# Patient Record
Sex: Female | Born: 1935 | Hispanic: No
Health system: Southern US, Community
[De-identification: ages and names within clinical notes are randomized; demographics above are authoritative.]

## PROBLEM LIST (undated history)

## (undated) DIAGNOSIS — E065 Other chronic thyroiditis: Secondary | ICD-10-CM

## (undated) DIAGNOSIS — F32A Depression, unspecified: Secondary | ICD-10-CM

## (undated) DIAGNOSIS — F05 Delirium due to known physiological condition: Secondary | ICD-10-CM

## (undated) DIAGNOSIS — F0390 Unspecified dementia without behavioral disturbance: Secondary | ICD-10-CM

## (undated) DIAGNOSIS — F329 Major depressive disorder, single episode, unspecified: Secondary | ICD-10-CM

## (undated) DIAGNOSIS — F19951 Other psychoactive substance use, unspecified with psychoactive substance-induced psychotic disorder with hallucinations: Secondary | ICD-10-CM

## (undated) DIAGNOSIS — R410 Disorientation, unspecified: Secondary | ICD-10-CM

---

## 2010-02-12 ENCOUNTER — Encounter: Payer: Self-pay | Admitting: Internal Medicine

## 2010-02-28 ENCOUNTER — Ambulatory Visit: Payer: Self-pay | Admitting: Internal Medicine

## 2010-02-28 DIAGNOSIS — N390 Urinary tract infection, site not specified: Secondary | ICD-10-CM

## 2010-02-28 DIAGNOSIS — E0590 Thyrotoxicosis, unspecified without thyrotoxic crisis or storm: Secondary | ICD-10-CM | POA: Insufficient documentation

## 2010-02-28 DIAGNOSIS — E785 Hyperlipidemia, unspecified: Secondary | ICD-10-CM

## 2010-02-28 DIAGNOSIS — F068 Other specified mental disorders due to known physiological condition: Secondary | ICD-10-CM

## 2010-02-28 LAB — CONVERTED CEMR LAB
Ketones, ur: NEGATIVE mg/dL
Leukocytes, UA: NEGATIVE
Nitrite: NEGATIVE
Specific Gravity, Urine: 1.025 (ref 1.000–1.030)
pH: 6.5 (ref 5.0–8.0)

## 2010-03-09 ENCOUNTER — Ambulatory Visit: Payer: Self-pay | Admitting: Endocrinology

## 2010-03-22 ENCOUNTER — Telehealth: Payer: Self-pay | Admitting: Endocrinology

## 2010-05-16 ENCOUNTER — Telehealth: Payer: Self-pay | Admitting: Endocrinology

## 2010-07-24 NOTE — Assessment & Plan Note (Signed)
Summary: NEW / MEDICARE / # / CD--Rm 8   Vital Signs:  Patient profile:   77 year old female Weight:      119.75 pounds O2 Sat:      97 % on Room air Temp:     97.0 degrees F oral Pulse rate:   90 / minute Pulse rhythm:   regular Resp:     16 per minute BP sitting:   120 / 50  (left arm) Cuff size:   regular  Vitals Entered By: Mervin Kung CMA (AAMA) (February 28, 2010 10:04 AM)  O2 Flow:  Room air CC: Rm 8  New pt.   Primary Care Provider:  Etta Grandchild MD  CC:  Rm 8  New pt..  History of Present Illness: New to me she is brought in by her son who reoprts that her PCP (Dr. Redmond School) at [facility] wants her screened for a UTI due to some lethargy she experiemced last week. She is back to baseline now. She offers no complaints and has been eating well but she is severely demented so symptoms are not obtainable.  Also, Dr. Redmond School has asked that she see an Endocrinologist as she had labs done a few weeks ago that showed a slightly suppressed TSH.  Preventive Screening-Counseling & Management  Alcohol-Tobacco     Alcohol drinks/day: 0     Smoking Status: never     Tobacco Counseling: not indicated; no tobacco use  Hep-HIV-STD-Contraception     Hepatitis Risk: no risk noted     HIV Risk: no risk noted     STD Risk: no risk noted      Drug Use:  no.    Current Medications (verified): 1)  Citalopram Hydrobromide 20 Mg Tabs (Citalopram Hydrobromide) .... Take 1 Tablet By Mouth Once A Day. 2)  Vitamin D 2000 Unit Tabs (Cholecalciferol) .... Take 1 Tablet By Mouth Once A Day. 3)  Mirtazapine 15 Mg Tabs (Mirtazapine) .... Take 1/2 Tablet At Bedtime. 4)  Trazodone Hcl 50 Mg Tabs (Trazodone Hcl) .... Take 1/2 Tablet At Bedtime. 5)  Exelon 9.5 Mg/24hr Pt24 (Rivastigmine) .... Apply 1 Patch Daily At 8am. 6)  Risperdal 0.25 Mg Tabs (Risperidone) .... Take 1 Tablet Every Morning. 7)  Tapazole 5 Mg Tabs (Methimazole) .... Take 1 Tablet By Mouth Two Times A Day.  Allergies  (verified): No Known Drug Allergies  Past History:  Past Medical History: Dementia Hyperlipidemia  Family History: Family History Breast cancer 1st degree relative <50 Family History Diabetes 1st degree relative  Social History: Retired Conservation officer, nature Never Smoked Alcohol use-no Drug use-no Smoking Status:  never Hepatitis Risk:  no risk noted HIV Risk:  no risk noted STD Risk:  no risk noted Drug Use:  no  Review of Systems       The patient complains of incontinence and difficulty walking.  The patient denies anorexia, fever, weight loss, weight gain, chest pain, peripheral edema, prolonged cough, headaches, hemoptysis, abdominal pain, and hematuria.   GU:  Complains of incontinence and urinary hesitancy; denies discharge, hematuria, nocturia, and urinary frequency.  Physical Exam  General:  alert, well-nourished, well-hydrated, poorly cooperative to examination, and unable to place on exam table.   Head:  normocephalic, atraumatic, no abnormalities observed, and no abnormalities palpated.   Eyes:  pink moist mm., no icterus or lesions Mouth:  Oral mucosa and oropharynx without lesions or exudates.  Teeth in good repair. Neck:  supple, full ROM, no masses, no thyromegaly, no  thyroid nodules or tenderness, no JVD, normal carotid upstroke, no carotid bruits, no cervical lymphadenopathy, and no neck tenderness.   Lungs:  normal respiratory effort, no intercostal retractions, no accessory muscle use, normal breath sounds, no dullness, no fremitus, no crackles, and no wheezes.   Heart:  normal rate, regular rhythm, no murmur, no gallop, no rub, and no JVD.   Abdomen:  soft, non-tender, normal bowel sounds, no distention, no masses, no guarding, no rigidity, no rebound tenderness, no abdominal hernia, no inguinal hernia, no hepatomegaly, and no splenomegaly.   Msk:  normal ROM, no joint tenderness, no joint swelling, no joint warmth, no redness over joints, no joint deformities, no  joint instability, and no crepitation.   Pulses:  R and L carotid,radial,femoral,dorsalis pedis and posterior tibial pulses are full and equal bilaterally Extremities:  No clubbing, cyanosis, edema, or deformity noted with normal full range of motion of all joints.   Neurologic:  Her speech is coherent but not appropriate with respect to the conversation of questions. cranial nerves II-XII intact, strength normal in all extremities, confused, and ataxic gait.   Skin:  turgor normal, color normal, no rashes, no suspicious lesions, no ecchymoses, no petechiae, no purpura, no ulcerations, and no edema.   Cervical Nodes:  no anterior cervical adenopathy and no posterior cervical adenopathy.   Axillary Nodes:  no R axillary adenopathy and no L axillary adenopathy.   Inguinal Nodes:  no R inguinal adenopathy and no L inguinal adenopathy.   Psych:  good eye contact, not anxious appearing, not depressed appearing, poor concentration, memory impairment, disoriented to time, disoriented to place, disoriented to person, and judgment poor.     Impression & Recommendations:  Problem # 1:  UTI (ICD-599.0) Assessment New will check urine and will treat if positive Orders: T-Urine Culture (Spectrum Order) [id number]) TLB-Udip w/ Micro (81001-URINE)  Problem # 2:  HYPERTHYROIDISM (ICD-242.90) Assessment: Unchanged  Her updated medication list for this problem includes:    Tapazole 5 Mg Tabs (Methimazole) .Marland Kitchen... Take 1 tablet by mouth two times a day.  Orders: Endocrinology Referral (Endocrine)  Problem # 3:  DEMENTIA (ICD-294.8) Assessment: Unchanged  Complete Medication List: 1)  Citalopram Hydrobromide 20 Mg Tabs (Citalopram hydrobromide) .... Take 1 tablet by mouth once a day. 2)  Vitamin D 2000 Unit Tabs (Cholecalciferol) .... Take 1 tablet by mouth once a day. 3)  Mirtazapine 15 Mg Tabs (Mirtazapine) .... Take 1/2 tablet at bedtime. 4)  Trazodone Hcl 50 Mg Tabs (Trazodone hcl) .... Take 1/2  tablet at bedtime. 5)  Exelon 9.5 Mg/24hr Pt24 (Rivastigmine) .... Apply 1 patch daily at 8am. 6)  Risperdal 0.25 Mg Tabs (Risperidone) .... Take 1 tablet every morning. 7)  Tapazole 5 Mg Tabs (Methimazole) .... Take 1 tablet by mouth two times a day.  Patient Instructions: 1)  Please schedule a follow-up appointment as needed.  Current Allergies (reviewed today): No known allergies

## 2010-07-24 NOTE — Progress Notes (Signed)
Summary: rx refill req  Phone Note From Pharmacy Call back at [phone]   Summary of Call: [facility] sent refill request for Methimazole 5mg  tablets take 1 tablet by mouth once daily-please advise Initial call taken by: Brenton Grills MA,  March 22, 2010 9:11 AM  Follow-up for Phone Call        please refill x 1 Follow-up by: Minus Breeding MD,  March 22, 2010 10:34 AM    New/Updated Medications: TAPAZOLE 5 MG TABS (METHIMAZOLE) take 1 tablet by mouth once daily Prescriptions: TAPAZOLE 5 MG TABS (METHIMAZOLE) take 1 tablet by mouth once daily  #30 x 1   Entered by:   Brenton Grills MA   Authorized by:   Minus Breeding MD   Signed by:   Brenton Grills MA on 03/22/2010   Method used:   Faxed to ...       [facility] (mail-order)             , [place]         Ph:        Fax: [phone]   RxID:   [id number]

## 2010-07-24 NOTE — Progress Notes (Signed)
Summary: rx refil req  Phone Note Refill Request Message from:  Fax from Pharmacy on May 16, 2010 4:54 PM  Refills Requested: Medication #1:  TAPAZOLE 5 MG TABS take 1 tablet by mouth once daily.   Dosage confirmed as above?Dosage Confirmed   Last Refilled: 03/18/2010  Method Requested: Fax to Mail Away Pharmacy Next Appointment Scheduled: none Initial call taken by: Brenton Grills CMA Duncan Dull),  May 16, 2010 4:54 PM    Prescriptions: TAPAZOLE 5 MG TABS (METHIMAZOLE) take 1 tablet by mouth once daily  #30 x 1   Entered by:   Brenton Grills CMA (AAMA)   Authorized by:   Minus Breeding MD   Signed by:   Brenton Grills CMA (AAMA) on 05/16/2010   Method used:   Faxed to ...       [facility] (mail-order)             , [place]         Ph:        Fax: [phone]   RxID:   [id number]

## 2010-07-24 NOTE — Assessment & Plan Note (Signed)
Summary: new endo hyperthy-medicare-per flag/dr jones-phone/[facility]   Vital Signs:  Patient profile:   77 year old female Weight:      119.75 pounds (54.43 kg) O2 Sat:      98 % on Room air Temp:     97.2 degrees F (36.22 degrees C) oral Pulse rate:   77 / minute BP sitting:   112 / 58  (left arm) Cuff size:   regular  Vitals Entered By: Brenton Grills MA (March 09, 2010 9:52 AM)  O2 Flow:  Room air CC: New Endo/Hyperthyroid/Dr. Jones/aj   Primary Provider:  Etta Grandchild MD  CC:  New Endo/Hyperthyroid/Dr. Jones/aj.  History of Present Illness: hx is limited due to dementia pt states few years of slight tremor of the hands, but no assoc numbness.  suppressed tsh was noted, and she was started on tapazole.  however, the start date of the tapazole is unknown.    Current Medications (verified): 1)  Citalopram Hydrobromide 20 Mg Tabs (Citalopram Hydrobromide) .... Take 1 Tablet By Mouth Once A Day. 2)  Vitamin D 2000 Unit Tabs (Cholecalciferol) .... Take 1 Tablet By Mouth Once A Day. 3)  Mirtazapine 15 Mg Tabs (Mirtazapine) .... Take 1/2 Tablet At Bedtime. 4)  Trazodone Hcl 50 Mg Tabs (Trazodone Hcl) .... Take 1/2 Tablet At Bedtime. 5)  Exelon 9.5 Mg/24hr Pt24 (Rivastigmine) .... Apply 1 Patch Daily At 8am. 6)  Risperdal 0.25 Mg Tabs (Risperidone) .... Take 1 Tablet Every Morning. 7)  Tapazole 5 Mg Tabs (Methimazole) .... Take 1 Tablet By Mouth Two Times A Day.  Allergies (verified): No Known Drug Allergies  Past History:  Past Medical History: Last updated: 02/28/2010 Dementia Hyperlipidemia  Family History: Reviewed history from 02/28/2010 and no changes required. Family History Breast cancer 1st degree relative <50 Family History Diabetes 1st degree relative no thyroid dz  Social History: Reviewed history from 02/28/2010 and no changes required. 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         denies weight loss, hoarseness, double vision, palpitations, diarrhea, polyuria, excessive diaphoresis, seizure, anxiety, hypoglycemia, and easy bruising.  caretaker says pt has had doe in the past.  she has arthralgias and/or myalgias, and rhinorrhea.  Physical Exam  General:  elderly, frail, no distress.  in wheelchair Head:  head: no deformity eyes: no periorbital swelling, no proptosis external nose and ears are normal mouth: no lesion seen Neck:  i do not appreciate a goiter Lungs:  Clear to auscultation bilaterally. Normal respiratory effort.  Heart:  Regular rate and rhythm without murmurs or gallops noted. Normal S1,S2.   Abdomen:  abdomen is soft, nontender.  no hepatosplenomegaly.   not distended.  no hernia  Msk:  muscle bulk and strength are grossly decreased from normal.  no obvious joint swelling.  Pulses:  dorsalis pedis intact bilat.  Extremities:  no deformity (except oa changes). no edema Neurologic:  cn 2-12 grossly intact.   readily moves all 4's.   sensation is intact to touch on all 4's there is a slight tremor of the hands Skin:  normal texture and temp.  no rash.  not diaphoretic  Cervical Nodes:  No significant adenopathy.  Psych:  Alert and cooperative; normal mood.  she has flat affect; not oriented   Impression & Recommendations:  Problem # 1:  HYPERTHYROIDISM (ICD-242.90) uncertain etiology  Problem # 2:  DEMENTIA (ICD-294.8) this limits interpretation of sxs  Problem # 3:  tremor possibly due to #1  Other Orders: TLB-TSH (Thyroid Stimulating Hormone) (84443-TSH) TLB-T4 (Thyrox), Free [phone]) Est. Patient Level V (01027)  Patient Instructions: 1)  blood tests are being ordered today.  we will call [facility] with results, and any med changes. 2)  if ever pt has fever while taking this medication, stop it and call us, because of the risk of a rare side-effect. 3)  Please  schedule a follow-up appointment in 1 month. 4)  (update: i left message on phone-tree:  reduce tapazole to 5 mg once daily.  Appended Document: new endo hyperthy-medicare-per [facility]/dr jones-phone/[facility] FastTSH                   0.68 uIU/mL                 0.35-5.50 Free T4                   0.92 ng/dL

## 2013-10-01 ENCOUNTER — Emergency Department (HOSPITAL_COMMUNITY): Payer: Medicare Other

## 2013-10-01 ENCOUNTER — Encounter (HOSPITAL_COMMUNITY): Payer: Self-pay | Admitting: Emergency Medicine

## 2013-10-01 ENCOUNTER — Inpatient Hospital Stay (HOSPITAL_COMMUNITY)
Admission: EM | Admit: 2013-10-01 | Discharge: 2013-10-05 | DRG: 864 | Disposition: A | Discharge status: Nursing Home (Includes ICF) | Payer: Medicare Other | Attending: Family Medicine | Admitting: Family Medicine

## 2013-10-01 DIAGNOSIS — R633 Feeding difficulties, unspecified: Secondary | ICD-10-CM | POA: Diagnosis present

## 2013-10-01 DIAGNOSIS — Z681 Body mass index (BMI) 19 or less, adult: Secondary | ICD-10-CM

## 2013-10-01 DIAGNOSIS — F3289 Other specified depressive episodes: Secondary | ICD-10-CM | POA: Diagnosis present

## 2013-10-01 DIAGNOSIS — F05 Delirium due to known physiological condition: Secondary | ICD-10-CM

## 2013-10-01 DIAGNOSIS — E785 Hyperlipidemia, unspecified: Secondary | ICD-10-CM | POA: Diagnosis present

## 2013-10-01 DIAGNOSIS — Z66 Do not resuscitate: Secondary | ICD-10-CM | POA: Diagnosis present

## 2013-10-01 DIAGNOSIS — Z515 Encounter for palliative care: Secondary | ICD-10-CM

## 2013-10-01 DIAGNOSIS — E0590 Thyrotoxicosis, unspecified without thyrotoxic crisis or storm: Secondary | ICD-10-CM | POA: Diagnosis present

## 2013-10-01 DIAGNOSIS — R7881 Bacteremia: Secondary | ICD-10-CM

## 2013-10-01 DIAGNOSIS — A419 Sepsis, unspecified organism: Secondary | ICD-10-CM | POA: Diagnosis present

## 2013-10-01 DIAGNOSIS — F0392 Unspecified dementia, unspecified severity, with psychotic disturbance: Secondary | ICD-10-CM | POA: Diagnosis present

## 2013-10-01 DIAGNOSIS — F068 Other specified mental disorders due to known physiological condition: Secondary | ICD-10-CM | POA: Diagnosis present

## 2013-10-01 DIAGNOSIS — F0390 Unspecified dementia without behavioral disturbance: Secondary | ICD-10-CM | POA: Diagnosis present

## 2013-10-01 DIAGNOSIS — E860 Dehydration: Secondary | ICD-10-CM | POA: Diagnosis present

## 2013-10-01 DIAGNOSIS — N390 Urinary tract infection, site not specified: Secondary | ICD-10-CM

## 2013-10-01 DIAGNOSIS — R509 Fever, unspecified: Principal | ICD-10-CM | POA: Diagnosis present

## 2013-10-01 DIAGNOSIS — F329 Major depressive disorder, single episode, unspecified: Secondary | ICD-10-CM | POA: Diagnosis present

## 2013-10-01 DIAGNOSIS — Z993 Dependence on wheelchair: Secondary | ICD-10-CM

## 2013-10-01 DIAGNOSIS — I959 Hypotension, unspecified: Secondary | ICD-10-CM | POA: Diagnosis present

## 2013-10-01 HISTORY — DX: Depression, unspecified: F32.A

## 2013-10-01 HISTORY — DX: Unspecified dementia, unspecified severity, without behavioral disturbance, psychotic disturbance, mood disturbance, and anxiety: F03.90

## 2013-10-01 HISTORY — DX: Delirium due to known physiological condition: F05

## 2013-10-01 HISTORY — DX: Other psychoactive substance use, unspecified with psychoactive substance-induced psychotic disorder with hallucinations: F19.951

## 2013-10-01 HISTORY — DX: Disorientation, unspecified: R41.0

## 2013-10-01 HISTORY — DX: Other chronic thyroiditis: E06.5

## 2013-10-01 HISTORY — DX: Major depressive disorder, single episode, unspecified: F32.9

## 2013-10-01 LAB — URINALYSIS, ROUTINE W REFLEX MICROSCOPIC
Bilirubin Urine: NEGATIVE
Glucose, UA: NEGATIVE mg/dL
Hgb urine dipstick: NEGATIVE
KETONES UR: NEGATIVE mg/dL
Nitrite: NEGATIVE
PROTEIN: NEGATIVE mg/dL
Specific Gravity, Urine: 1.022 (ref 1.005–1.030)
Urobilinogen, UA: 1 mg/dL (ref 0.0–1.0)
pH: 7.5 (ref 5.0–8.0)

## 2013-10-01 LAB — URINE MICROSCOPIC-ADD ON

## 2013-10-01 LAB — COMPREHENSIVE METABOLIC PANEL
ALK PHOS: 65 U/L (ref 39–117)
ALT: 11 U/L (ref 0–35)
AST: 14 U/L (ref 0–37)
Albumin: 3.2 g/dL — ABNORMAL LOW (ref 3.5–5.2)
BUN: 14 mg/dL (ref 6–23)
CO2: 24 mEq/L (ref 19–32)
Calcium: 8.4 mg/dL (ref 8.4–10.5)
Chloride: 100 mEq/L (ref 96–112)
Creatinine, Ser: 0.71 mg/dL (ref 0.50–1.10)
GFR calc non Af Amer: 80 mL/min — ABNORMAL LOW (ref >90)
GLUCOSE: 114 mg/dL — AB (ref 70–99)
Potassium: 4.1 mEq/L (ref 3.7–5.3)
Sodium: 137 mEq/L (ref 137–147)
TOTAL PROTEIN: 6 g/dL (ref 6.0–8.3)
Total Bilirubin: 1.7 mg/dL — ABNORMAL HIGH (ref 0.3–1.2)

## 2013-10-01 LAB — CBC WITH DIFFERENTIAL/PLATELET
Basophils Absolute: 0 10*3/uL (ref 0.0–0.1)
Basophils Relative: 0 % (ref 0–1)
EOS ABS: 0 10*3/uL (ref 0.0–0.7)
Eosinophils Relative: 0 % (ref 0–5)
HEMATOCRIT: 37 % (ref 36.0–46.0)
HEMOGLOBIN: 12.3 g/dL (ref 12.0–15.0)
LYMPHS ABS: 0.4 10*3/uL — AB (ref 0.7–4.0)
Lymphocytes Relative: 8 % — ABNORMAL LOW (ref 12–46)
MCH: 30.1 pg (ref 26.0–34.0)
MCHC: 33.2 g/dL (ref 30.0–36.0)
MCV: 90.5 fL (ref 78.0–100.0)
MONOS PCT: 3 % (ref 3–12)
Monocytes Absolute: 0.2 10*3/uL (ref 0.1–1.0)
NEUTROS PCT: 89 % — AB (ref 43–77)
Neutro Abs: 5.1 10*3/uL (ref 1.7–7.7)
Platelets: 97 10*3/uL — ABNORMAL LOW (ref 150–400)
RBC: 4.09 MIL/uL (ref 3.87–5.11)
RDW: 12.7 % (ref 11.5–15.5)
WBC: 5.8 10*3/uL (ref 4.0–10.5)

## 2013-10-01 LAB — I-STAT CG4 LACTIC ACID, ED: Lactic Acid, Venous: 0.82 mmol/L (ref 0.5–2.2)

## 2013-10-01 LAB — TSH: TSH: 0.715 u[IU]/mL (ref 0.350–4.500)

## 2013-10-01 MED ORDER — PIPERACILLIN-TAZOBACTAM 3.375 G IVPB 30 MIN
3.3750 g | Freq: Once | INTRAVENOUS | Status: AC
  Administered 2013-10-01: 3.375 g via INTRAVENOUS
  Filled 2013-10-01: qty 50

## 2013-10-01 MED ORDER — METHIMAZOLE 5 MG PO TABS
5.0000 mg | ORAL_TABLET | Freq: Every day | ORAL | Status: DC
  Administered 2013-10-03 – 2013-10-05 (×3): 5 mg via ORAL
  Filled 2013-10-01 (×4): qty 1

## 2013-10-01 MED ORDER — VITAMIN D3 25 MCG (1000 UNIT) PO TABS
1000.0000 [IU] | ORAL_TABLET | Freq: Every day | ORAL | Status: DC
  Administered 2013-10-03 – 2013-10-05 (×3): 1000 [IU] via ORAL
  Filled 2013-10-01 (×5): qty 1

## 2013-10-01 MED ORDER — CITALOPRAM HYDROBROMIDE 20 MG PO TABS
20.0000 mg | ORAL_TABLET | Freq: Every day | ORAL | Status: DC
  Administered 2013-10-03 – 2013-10-05 (×3): 20 mg via ORAL
  Filled 2013-10-01 (×4): qty 1

## 2013-10-01 MED ORDER — SENNA 8.6 MG PO TABS
2.0000 | ORAL_TABLET | Freq: Two times a day (BID) | ORAL | Status: DC
  Administered 2013-10-01 – 2013-10-05 (×7): 17.2 mg via ORAL
  Filled 2013-10-01 (×10): qty 2

## 2013-10-01 MED ORDER — ATORVASTATIN CALCIUM 20 MG PO TABS
20.0000 mg | ORAL_TABLET | Freq: Every day | ORAL | Status: DC
  Administered 2013-10-01 – 2013-10-04 (×4): 20 mg via ORAL
  Filled 2013-10-01 (×5): qty 1

## 2013-10-01 MED ORDER — IOHEXOL 300 MG/ML  SOLN
75.0000 mL | Freq: Once | INTRAMUSCULAR | Status: AC | PRN
  Administered 2013-10-01: 75 mL via INTRAVENOUS

## 2013-10-01 MED ORDER — ACETAMINOPHEN 500 MG PO TABS: 500.0000 mg | ORAL_TABLET | Freq: Four times a day (QID) | ORAL | Status: DC | PRN

## 2013-10-01 MED ORDER — PIPERACILLIN-TAZOBACTAM 3.375 G IVPB: 3.3750 g | Freq: Three times a day (TID) | INTRAVENOUS | Status: DC

## 2013-10-01 MED ORDER — SODIUM CHLORIDE 0.9 % IV SOLN: INTRAVENOUS | Status: DC

## 2013-10-01 MED ORDER — VITAMIN D-3 25 MCG (1000 UT) PO CAPS: 1000.0000 [IU] | ORAL_CAPSULE | Freq: Every day | ORAL | Status: DC

## 2013-10-01 MED ORDER — HEPARIN SODIUM (PORCINE) 5000 UNIT/ML IJ SOLN
5000.0000 [IU] | Freq: Three times a day (TID) | INTRAMUSCULAR | Status: DC
  Administered 2013-10-01 – 2013-10-05 (×12): 5000 [IU] via SUBCUTANEOUS
  Filled 2013-10-01 (×15): qty 1

## 2013-10-01 MED ORDER — PIPERACILLIN-TAZOBACTAM 3.375 G IVPB
3.3750 g | Freq: Three times a day (TID) | INTRAVENOUS | Status: DC
  Administered 2013-10-01 – 2013-10-04 (×8): 3.375 g via INTRAVENOUS
  Filled 2013-10-01 (×10): qty 50

## 2013-10-01 MED ORDER — ACETAMINOPHEN 650 MG RE SUPP
650.0000 mg | Freq: Once | RECTAL | Status: AC
  Administered 2013-10-01: 650 mg via RECTAL
  Filled 2013-10-01: qty 1

## 2013-10-01 MED ORDER — VANCOMYCIN HCL 500 MG IV SOLR
500.0000 mg | Freq: Two times a day (BID) | INTRAVENOUS | Status: DC
  Administered 2013-10-01 – 2013-10-03 (×5): 500 mg via INTRAVENOUS
  Filled 2013-10-01 (×7): qty 500

## 2013-10-01 MED ORDER — SODIUM CHLORIDE 0.9 % IV BOLUS (SEPSIS)
1000.0000 mL | Freq: Once | INTRAVENOUS | Status: AC
  Administered 2013-10-01: 1000 mL via INTRAVENOUS

## 2013-10-01 MED ORDER — VANCOMYCIN HCL IN DEXTROSE 750-5 MG/150ML-% IV SOLN
750.0000 mg | Freq: Two times a day (BID) | INTRAVENOUS | Status: DC
  Filled 2013-10-01 (×3): qty 150

## 2013-10-01 MED ORDER — SODIUM CHLORIDE 0.9 % IV SOLN
INTRAVENOUS | Status: DC
Start: 2013-10-01 — End: 2013-10-03
  Administered 2013-10-01 – 2013-10-02 (×2): via INTRAVENOUS

## 2013-10-01 MED ORDER — PIPERACILLIN-TAZOBACTAM 3.375 G IVPB: 3.3750 g | Freq: Four times a day (QID) | INTRAVENOUS | Status: DC

## 2013-10-01 NOTE — ED Notes (Signed)
Pt rolled and cleaned; fresh depends; urine specimen retrieved.

## 2013-10-01 NOTE — H&P (Signed)
I have seen and examined this patient with Dr Birdie SonsSonnenberg.  I agree with his findings and plans as documented in their his admit note.  Systemic Inflammatory Response - (+) Fever, (+) Tachypnea, HR > 90 - Uncertain if there has been any change in cognition or function. - Unable to obtain history from patient secondary to Very Severely Advance Dementia.  - No focal findings on Physical exam, Labs and imaging. - Plan: Empiric Broad Spectrum antibiotics (Vanc/Zosyn) with plan to narrow or stop with clinical improvement, and culture results.  Will repeat CHEST XRAY in 24 to 48 hours to look for an infiltrate "blossoming".

## 2013-10-01 NOTE — ED Notes (Signed)
Patient transported to X-ray 

## 2013-10-01 NOTE — H&P (Signed)
Family Medicine [facility] Admission History and Physical Service Pager: [phone]  Patient name: Desiree RaymondRosie Arias Medical record number: [id number] Date of birth: 02-18-1932 Age: 80 y.o. Gender: female  Primary Care Provider: Sanda Lingerhomas Jones, MD Consultants: none Code Status: DNR  Chief Complaint: fever  Assessment and Plan: Desiree RaymondRosie Arias is a 80 y.o. female presenting with fever to 102.8 with unknown source. PMH is significant for dementia (non verbal), hyperlipidemia, hyperthyroidism   #FUO: initially meeting SIRS criteria at [facility] by tachycardia, fever, and mild hypotension. Resolved on presentation to the ED. Ddx at this time is broad as diagnostic w/up thus far negative. Including URI (mildly tachypneic with crackles on pulm exam but negative cxr/ct), UTI (no indwelling lines, urine with mild leuks no nitrites), gastro (no episodes of diarrhea per home facility), soft tissue/skin infection (no signs of breakdown, ulceration or rashes). Certainly some component of dehydration. Possible early signs of infection that has not yet declared itself. History limited 2/2 patient's inability to communicate -admit to med surg under Dr. McDiarmid -vs per floor protocol -trend fever curve -f/up cultures (Bcx, Ucx) -monitor for changes in clinical exam -rehydration with NS @125  -hold on further abx for now -tylenol prn -consider CBC tmw morning  #Hyperthyroidism: with enlargement of thyroid on CT; TSH 0.68, free T4 0.92 -repeat TSH, T4 -cont methimazole  #Hyperlipidemia: no documented lipid panel; LFTs wnl -cont home dose statin: lipitor 20 -monitor LFTs  #Dementia: Pt non verbal at baseline; will attempt to touch base with daughter to obtain further history. Withdraws limbs to painful stimuli, will open eyes to movements; severe contractures; no signs of skin break down or bruising. Unable to contact daughter, left message to call back to hospital. -cont celexa -avoid  sedating medications -consider very low doses haldol prn if needed for acute delirium; will not schedule at this time -bed turns to avoid pressure ulcers  #FEN/GI: clinically appears dehydrated likely 2/2 above but no signs of insensible losses at this time other than tachypnea and increased WOB -NPO, until SLP eval -NS@125  -senna 17.2mg   Prophylaxis: Hepsq  Disposition: admit to med-surg under Dr. McDiarmid; will attempt to touch base with Daughter. Unable to contact daughter, left message to call back to hospital.  History of Present Illness: Desiree Arias is a 80 y.o. female presenting with tmax at [facility] to 102.8 assc with tachycardia. Level 5 caveat applies 2/2 pt's advanced dementia. Pt brought to ED for further evaluation given SIRS criteria at home facility. Pt otherwise unchanged in last several days, functioning at baseline. Daughter spoken to over phone by the ED physician does not provide any further explanation for fever.   In the ED tmax 99 rectal after tylenol given by EMS. Was mildly hypotensive and given a 1L bolus with improvement in BP to 130s. CBC, CMP, UA, lactic acid wnl. No white count. (5.8) CXR with some mediastinal adenopathy. CT chest obtained, only showing an enlarged thyroid but no mediastinal mass. Ucx, Bcx obtained. Fever noted to be 100.2. But pt normotensive without tachycardia. Pt admitted for observation given fever of unknown origin.   Review Of Systems: Per HPI with the following additions: no recent diarrhea, cough, congestion, cold, rashes, ulcers or skin breakdown per nursing home report Otherwise 12 point review of systems was performed and was unremarkable.  Patient Active Problem List   Diagnosis Date Noted  . HYPERTHYROIDISM 02/28/2010  . HYPERLIPIDEMIA 02/28/2010  . DEMENTIA 02/28/2010  . UTI 02/28/2010   Past Medical History: Past Medical History  Diagnosis Date  . Dementia   . Senile delirium   . Drug hallucinosis   . Chronic  thyroiditis   . Depressive disorder    Past Surgical History: History reviewed. No pertinent past surgical history. Social History: History  Substance Use Topics  . Smoking status: Unknown If Ever Smoked  . Smokeless tobacco: Not on file  . Alcohol Use: No   Additional social history: lives at [facility] Please also refer to relevant sections of EMR.  Family History: History reviewed. No pertinent family history. Allergies and Medications: Not on File No current facility-administered medications on file prior to encounter.   No current outpatient prescriptions on file prior to encounter.    Objective: BP 137/44  Pulse 76  Temp(Src) 100.2 F (37.9 C) (Rectal)  Resp 25  Ht 5\' 4"  (1.626 m)  Wt 110 lb (49.896 kg)  BMI 18.87 kg/m2  SpO2 95% Exam: Gen: Resting quietly in bed, will not open eyes initially to sternal rub but withdraws to pain, eventually opened eyes when patient rolled to visualize her back side HEENT: NCAT,(unable to assess EOMI, pupils 2/2 poor pt cooperation), dry MM, TMs nml Neck: FROM, supple, no lymphadenopathy CV: RRR, good S1/S2, no murmur, cap refill <3 Resp: tachypneic, scattered crackles in the left lung base otherwise CTABL, no wheezes, non-labored Abd: SNTND, BS present, no guarding or organomegaly Ext: No edema, warm, bilat extremity contractures Neuro: non verbal, withdraws to pain Skin: no rashes no lesions or evidence of skin breakdown note especially not in sacral region, buttocks, and hips   Labs and Imaging: CBC BMET   Recent Labs Lab 10/01/13 1002  WBC 5.8  HGB 12.3  HCT 37.0  PLT 97*    Recent Labs Lab 10/01/13 1002  NA 137  K 4.1  CL 100  CO2 24  BUN 14  CREATININE 0.71  GLUCOSE 114*  CALCIUM 8.4     EKG- sinus rhythm, biatrial enlargement, prolong qt CXR- mediastinal adenopathy CT chest- enlarged thyroid  Anselm Lis, MD 10/01/2013, 1:39 PM PGY-1, [facility] FPTS Intern pager: [phone],  text pages welcome  Upper Level Addendum:  I have seen and evaluated this patient along with Dr. Michail Jewels and reviewed the above note, making necessary revisions in red.   Marikay Alar, MD Family Medicine PGY-2

## 2013-10-01 NOTE — ED Provider Notes (Addendum)
CSN: [id number]     Arrival date & time 10/01/13  [id number] History   First MD Initiated Contact with Patient 10/01/13 [phone]     Chief Complaint  Patient presents with  . Fever     (Consider location/radiation/quality/duration/timing/severity/associated sxs/prior Treatment) Patient is a 80 y.o. female presenting with fever. The history is provided by the EMS personnel and the nursing home. The history is limited by the absence of a caregiver and the condition of the patient.  Fever Max temp prior to arrival:  102.8 Temp source:  Oral Severity:  Severe Onset quality:  Sudden Duration:  1 day Timing:  Constant Progression:  Unchanged Chronicity:  New Associated symptoms: no cough and no vomiting   Associated symptoms comment:  Severe dementia and contractures at baseline   Past Medical History  Diagnosis Date  . Dementia   . Senile delirium   . Drug hallucinosis   . Chronic thyroiditis   . Depressive disorder    History reviewed. No pertinent past surgical history. History reviewed. No pertinent family history. History  Substance Use Topics  . Smoking status: Unknown If Ever Smoked  . Smokeless tobacco: Not on file  . Alcohol Use: No   OB History   Grav Para Term Preterm Abortions TAB SAB Ect Mult Living                 Review of Systems  Unable to perform ROS Constitutional: Positive for fever.  Respiratory: Negative for cough.   Gastrointestinal: Negative for vomiting.      Allergies  Review of patient's allergies indicates not on file.  Home Medications  No current outpatient prescriptions on file. BP 149/58  Pulse 94  Temp(Src) 99.3 F (37.4 C) (Rectal)  Resp 23  Ht 5\' 4"  (1.626 m)  Wt 110 lb (49.896 kg)  BMI 18.87 kg/m2  SpO2 97% Physical Exam  Nursing note and vitals reviewed. Constitutional: She appears well-developed and well-nourished. No distress.  HENT:  Head: Normocephalic and atraumatic.  Mouth/Throat: Oropharynx is clear and moist.   Eyes: Conjunctivae and EOM are normal. Pupils are equal, round, and reactive to light.  Neck: Normal range of motion. Neck supple.  Cardiovascular: Normal rate, regular rhythm and intact distal pulses.   No murmur heard. Pulmonary/Chest: Tachypnea noted. No respiratory distress. She has no wheezes. She has rhonchi in the left middle field. She has no rales.  Abdominal: Soft. She exhibits no distension. There is no tenderness. There is no rebound and no guarding.  Musculoskeletal: Normal range of motion. She exhibits no edema and no tenderness.  Contractures of bilateral lower extremities  Neurological: She is alert.  Non-verbal  Skin: Skin is warm and dry. No rash noted. No erythema.  Warm to the touch    ED Course  Procedures (including critical care time) Labs Review Labs Reviewed  CBC WITH DIFFERENTIAL - Abnormal; Notable for the following:    Platelets 97 (*)    Neutrophils Relative % 89 (*)    Lymphocytes Relative 8 (*)    Lymphs Abs 0.4 (*)    All other components within normal limits  COMPREHENSIVE METABOLIC PANEL - Abnormal; Notable for the following:    Glucose, Bld 114 (*)    Albumin 3.2 (*)    Total Bilirubin 1.7 (*)    GFR calc non Af Amer 80 (*)    All other components within normal limits  URINALYSIS, ROUTINE W REFLEX MICROSCOPIC - Abnormal; Notable for the following:    Leukocytes, UA  TRACE (*)    All other components within normal limits  URINE MICROSCOPIC-ADD ON - Abnormal; Notable for the following:    Bacteria, UA FEW (*)    All other components within normal limits  I-STAT CG4 LACTIC ACID, ED   Imaging Review Dg Chest 2 View  10/01/2013   CLINICAL DATA:  Fever  EXAM: CHEST  2 VIEW  COMPARISON:  None.  FINDINGS: Right peritracheal soft tissue mass compatible with mediastinal adenopathy. This has a lobular contour. No hilar adenopathy or peripheral lung mass  Heart size and vascularity are normal. Negative for infiltrate or effusion.  IMPRESSION:  Mediastinal adenopathy. Recommend CT chest with contrast to rule out a neoplastic process such as carcinoma of the lung or lymphoma.  Negative for pneumonia.   Electronically Signed   By: Marlan Palau M.D.   On: 10/01/2013 11:28     EKG Interpretation   Date/Time:  Friday October 01 2013 09:52:37 EDT Ventricular Rate:  86 PR Interval:  164 QRS Duration: 79 QT Interval:  427 QTC Calculation: 511 R Axis:   79 Text Interpretation:  Sinus rhythm Biatrial enlargement Prolonged QT  interval Baseline wander in lead(s) V2 V5 No previous tracing Confirmed by  Anitra Lauth  MD, Princessa Lesmeister ([id number]) on 10/01/2013 10:52:04 AM      MDM   Final diagnoses:  Fever of unknown origin    Patient presenting from nursing facility with a history of severe dementia and a temperature today of 102.5. Patient was given Tylenol prior to transport with EMS and upon arrival here temperature is improved to 99. There is no other history given such as URI symptoms vomiting or diarrhea. Patient does have minimal rhonchi on the left side which could be dependent atelectasis but will do a chest x-ray to insure no pulmonary cause for the infection as well as a urine to evaluate for UTI. CBC, CMP, UA, lactic acid, chest x-ray pending.   Patient mildly hypotensive. 99 and given a 1 L bolus.  11:46 AM CXR with mediastinal adenopathy recommending Chest CT which was ordered.  No signs of PNA or UTI that would explain fever. After 1L bolus pt's BP has been in the 130's and 140's systolic.  1:23 PM w Pt with recurrent fever and tachycardia and given another dose of tylenol.  Currently fever of unknown origin.  CT showed enlarge thyroid but no other issues.  Will admit for further care.  Blood cultures drawn.  Gwyneth Sprout, MD 10/01/13 1324  Gwyneth Sprout, MD 10/01/13 1337

## 2013-10-01 NOTE — Progress Notes (Addendum)
ANTIBIOTIC CONSULT NOTE - INITIAL  Pharmacy Consult for vancomycin Indication: fever of unknown origin  Not on File  Patient Measurements: Height: 5\' 4"  (162.6 cm) Weight: 110 lb (49.896 kg) IBW/kg (Calculated) : 54.7  Vital Signs: Temp: 100.2 F (37.9 C) (04/10 1204) Temp src: Rectal (04/10 1204) BP: 137/44 mmHg (04/10 1215) Pulse Rate: 76 (04/10 1215) Intake/Output from previous day:   Intake/Output from this shift: Total I/O In: 1000 [I.V.:1000] Out: -   Labs:  Recent Labs  10/01/13 1002  WBC 5.8  HGB 12.3  PLT 97*  CREATININE 0.71   Estimated Creatinine Clearance: 44.9 ml/min (by C-G formula based on Cr of 0.71). No results found for this basename: VANCOTROUGH, VANCOPEAK, VANCORANDOM, GENTTROUGH, GENTPEAK, GENTRANDOM, TOBRATROUGH, TOBRAPEAK, TOBRARND, AMIKACINPEAK, AMIKACINTROU, AMIKACIN,  in the last 72 hours   Microbiology: No results found for this or any previous visit (from the past 720 hour(s)).  Medical History: Past Medical History  Diagnosis Date  . Dementia   . Senile delirium   . Drug hallucinosis   . Chronic thyroiditis   . Depressive disorder     Assessment: [id number] YOF from [facility] with fever of 102.8 and AMS. WBC 5.8, Tmax in ED 100.2 (did receive APAP from EMS). BP and HR nml. Received Zosyn 3.375g IV x1 in the ED. SCr 0.71, est CrCl ~5345mL/min.  Goal of Therapy:  Vancomycin trough level 15-20 mcg/ml  Plan:  1. Vancomycin 750mg  IV q12h 2. Zosyn 3.375g IV q8h EI 3. [facility] renal function, clinical progression, c[facility]  Lauren D. Bajbus, PharmD, BCPS Clinical Pharmacist Pager: [phone] 10/01/2013 1:53 PM  ----------------------------------------------------------------------------------------------- Addendum:  Vancomycin was discontinued and then re-ordered to be dosed by pharmacy. Given the patient's weight and renal function, will reduce Vancomycin dose slightly. No Vancomycin has yet to be given today.  Plan 1.  Vancomycin 500 mg IV every 12 hours 2. Will continue to follow renal function, culture results, LOT, and antibiotic de-escalation plans   Georgina PillionElizabeth Finlee Milo, PharmD, BCPS Clinical Pharmacist Pager: [phone] 10/01/2013 4:28 PM

## 2013-10-01 NOTE — ED Notes (Signed)
PTAR said that [facility], the patient's temperature was 102.8 at 0700 this morning.  Staff gave her 500mg  of Tylenol by mouth rechecked her temperature at 0830 and it was 102.5 so they called EMS.  Patient was brought here to be evaluated.

## 2013-10-01 NOTE — ED Notes (Signed)
Pt returned from xray; back on monitor.  

## 2013-10-01 NOTE — ED Notes (Signed)
[facility] updated on pt's status. Call back number [phone]

## 2013-10-01 NOTE — ED Notes (Signed)
Md Anitra Lauthlunkett made aware of pt's HR 108 and increasing rectal temperature. Pt remains at baseline.

## 2013-10-02 DIAGNOSIS — R7881 Bacteremia: Secondary | ICD-10-CM | POA: Diagnosis present

## 2013-10-02 LAB — CBC WITH DIFFERENTIAL/PLATELET
BASOS PCT: 0 % (ref 0–1)
Basophils Absolute: 0 10*3/uL (ref 0.0–0.1)
Eosinophils Absolute: 0 10*3/uL (ref 0.0–0.7)
Eosinophils Relative: 0 % (ref 0–5)
HCT: 37.2 % (ref 36.0–46.0)
Hemoglobin: 12.2 g/dL (ref 12.0–15.0)
Lymphocytes Relative: 13 % (ref 12–46)
Lymphs Abs: 0.7 10*3/uL (ref 0.7–4.0)
MCH: 29.6 pg (ref 26.0–34.0)
MCHC: 32.8 g/dL (ref 30.0–36.0)
MCV: 90.3 fL (ref 78.0–100.0)
MONOS PCT: 8 % (ref 3–12)
Monocytes Absolute: 0.4 10*3/uL (ref 0.1–1.0)
NEUTROS ABS: 4 10*3/uL (ref 1.7–7.7)
NEUTROS PCT: 79 % — AB (ref 43–77)
PLATELETS: 91 10*3/uL — AB (ref 150–400)
RBC: 4.12 MIL/uL (ref 3.87–5.11)
RDW: 12.7 % (ref 11.5–15.5)
WBC: 5 10*3/uL (ref 4.0–10.5)

## 2013-10-02 LAB — T4, FREE: FREE T4: 1.2 ng/dL (ref 0.80–1.80)

## 2013-10-02 LAB — COMPREHENSIVE METABOLIC PANEL
ALT: 17 U/L (ref 0–35)
AST: 32 U/L (ref 0–37)
Albumin: 3.1 g/dL — ABNORMAL LOW (ref 3.5–5.2)
Alkaline Phosphatase: 58 U/L (ref 39–117)
BILIRUBIN TOTAL: 1.8 mg/dL — AB (ref 0.3–1.2)
BUN: 10 mg/dL (ref 6–23)
CHLORIDE: 98 meq/L (ref 96–112)
CO2: 20 mEq/L (ref 19–32)
Calcium: 8 mg/dL — ABNORMAL LOW (ref 8.4–10.5)
Creatinine, Ser: 0.65 mg/dL (ref 0.50–1.10)
GFR calc Af Amer: >90 mL/min (ref >90)
GFR calc non Af Amer: 82 mL/min — ABNORMAL LOW (ref >90)
Glucose, Bld: 87 mg/dL (ref 70–99)
POTASSIUM: 3.6 meq/L — AB (ref 3.7–5.3)
SODIUM: 133 meq/L — AB (ref 137–147)
Total Protein: 5.7 g/dL — ABNORMAL LOW (ref 6.0–8.3)

## 2013-10-02 LAB — BILIRUBIN, DIRECT: Bilirubin, Direct: 0.3 mg/dL (ref 0.0–0.3)

## 2013-10-02 LAB — URINE CULTURE
Colony Count: NO GROWTH
Culture: NO GROWTH

## 2013-10-02 NOTE — Progress Notes (Signed)
I have seen and examined this patient. I have discussed with Dr Street.  I agree with their findings and plans as documented in their progress note.    

## 2013-10-02 NOTE — Progress Notes (Addendum)
PGY-2 Interim Note  Spoke with pt's daughter by phone, Desiree Arias (lives in [place], [facility]; home [phone], first-call cell [phone]). Desiree Arias related to me that over the past about two years, pt's dementia has been slowly worsening and she has been increasingly nonverbal (worse for the past two months) and does not recognize either her (Desiree Arias) or her brother (pt's son, who lives in [place]). Pt's first language is [place], and when she does rarely speak it is often in [place] that does not make sense.   Desiree Arias reports that about 1 month ago, pt had some overnight vomiting that resolved without intervention, but other than having some decreased PO intake last week, she has not appeared to have any new symptoms or illness prior to admission; Desiree Arias states that pt has not been admitted to the hospital prior to this time, in her life, and prior to her dementia becoming more frank, was not even on any medications. Pt does not have a history of PNA or UTI's to Desiree Arias's knowledge.  Desiree Arias described to me that pt is indeed a DNR and that she and other family members would not want any aggressive care if pt should decline. However, pt does not appear currently to be in imminent need of palliative or comfort measures. Despite this, Desiree Arias stated over the phone that she and her brother would likely be agreeable to a palliative care consult to define specific GOC in the event of any decline. She and her brother are planning on being at the hospital this afternoon and I will speak with them in person, and likely will consult palliative care later this afternoon.  Desiree Mortonhristopher M Fayelynn Distel, MD PGY-2, [facility] 10/02/2013, 1:17 PM FPTS Service pager: [phone] (text pages welcome through [employer])  Addendum: In addition to the above, I also discussed pt's current condition with her daughter (generally stable, clearly demented but able to eat,  not requiring oxygen, on IV abx, with some intermittent low-grade persistent fevers), and the current plan to continue abx until we have confidence to narrow appropriately (hopefully guided by culture results). Received page from RN after talking with daughter that pt has Gram positive cocci in clusters on blood culture; will continue broad-spectrum antibiotics for now until speciation / sensitivities result. Will discuss this result with daughter and son in person, as well.   Desiree Mortonhristopher M Olie Dibert, MD PGY-2, [facility] 10/02/2013, 1:28 PM FPTS Service pager: [phone] (text pages welcome through [facility])  Addendum: Discussed the above with pt's daughter [facility](HCPOA) and her son, Desiree Arias (cell phone [phone]). Answered all questions to their satisfaction. They do request a meeting with the palliative care team, which I will help arrange with a formal consult to them, today. Desiree Arias also provided a copy of pt's living will and expressed interest when discussing a MOST form; copy of living will placed on pt's physical chart.  Desiree Mortonhristopher M Felma Pfefferle, MD PGY-2, [facility] 10/02/2013, 2:51 PM FPTS Service pager: [phone] (text pages welcome through [facility])

## 2013-10-02 NOTE — Progress Notes (Signed)
[facility] called, + blood cultures for gram + cocci in clusters, texted to Dr. Casper HarrisonStreet who returned with call to acknowedge.

## 2013-10-02 NOTE — Evaluation (Signed)
Clinical/Bedside Swallow Evaluation Patient Details  Name: Desiree Arias MRN: [id number] Date of Birth: 1931-09-24  Today's Date: 10/02/2013 Time: [id number] SLP Time Calculation (min): 16 min  Past Medical History:  Past Medical History  Diagnosis Date  . Dementia   . Senile delirium   . Drug hallucinosis   . Chronic thyroiditis   . Depressive disorder    Past Surgical History: History reviewed. No pertinent past surgical history. HPI:  80 y.o. female presenting with fever to 102.8 with unknown source. PMH is significant for dementia (non verbal), hyperlipidemia, hyperthyroidism    Assessment / Plan / Recommendation Clinical Impression  Pt presents with remarkably functional swallow given advanced dementia.  Despite inability to follow commands, no opening of her eyes, no initiation/acting upon environment, pt was receptive to eating.  She demonstrated anticipation of bolus material to lips, active oral preparation, intermittent oral holding, presence of swallow, and no overt s/s of aspiration.  There is potential for silent aspiration with her dx, however CXR shows no indication of pna.  Recommend beginning a dysphagia 1 diet with thin liquids; crush meds; offer careful hand-feeding and cease feeding when pt is not alert.   Family may benefit from discussion re: ultimate impact of dementia upon swallowing, the inherent risk of aspiration, and the contraindications of enteral feeding in this population.    No further SLP f/u warranted.      Aspiration Risk  Moderate    Diet Recommendation Dysphagia 1 (Puree);Thin liquid   Liquid Administration via: Cup Medication Administration: Crushed with puree Supervision:  (staff to feed) Compensations: Small sips/bites;Check for pocketing Postural Changes and/or Swallow Maneuvers: Seated upright 90 degrees    Other  Recommendations Oral Care Recommendations: Oral care BID   Follow Up Recommendations  None     Swallow Study Prior  Functional Status       General Date of Onset: 10/01/13   Type of Study: Bedside swallow evaluation Previous Swallow Assessment: none per records Diet Prior to this Study: Information not available Temperature Spikes Noted: Yes Respiratory Status: Room air Behavior/Cognition: Alert;Doesn't follow directions Self-Feeding Abilities: Total assist Patient Positioning: Partially reclined Volitional Cough: Cognitively unable to elicit Volitional Swallow: Unable to elicit    Oral/Motor/Sensory Function Overall Oral Motor/Sensory Function: Other (comment) (symmetry intact)   Ice Chips Ice chips: Within functional limits   Thin Liquid Thin Liquid: Within functional limits Presentation: Spoon;Cup    Nectar Thick Nectar Thick Liquid: Not tested   Honey Thick Honey Thick Liquid: Not tested   Puree Puree: Impaired Presentation: Spoon Oral Phase Functional Implications: Prolonged oral transit;Oral holding   Solid  Desiree Arias L. [place], [place] CCC/SLP Pager [phone]     Solid: Not tested       Desiree Arias 10/02/2013,12:02 PM

## 2013-10-02 NOTE — Progress Notes (Signed)
Family Medicine Teaching Service Daily Progress Note Intern Pager: [phone]  Patient name: Gudrun Axe Medical record number: [id number] Date of birth: Apr 16, 1932 Age: 80 y.o. Gender: female  Primary Care Provider: Scarlette Calico, MD Consultants: none; consider palliative Code Status: DNR  Pt Overview and Major Events to Date: Toneisha Savary is a 80 y.o. female presenting with fever to 102.8 with unknown source. PMH is significant for severe dementia (non verbal), hyperlipidemia, hyperthyroidism. 4/10: admitted; met SIRS criteria, though vitals improved by arrival to the ED, started on vanc, Zosyn 4/11: remains nonverbal, intermittent fevers to 100.8  Assessment and Plan:   #FUO: initially met SIRS criteria at [facility], though SIRS resolved on presentation to the ED.  - DDx broad, includes URI (mildly tachypneic with crackles on pulm exam but negative cxr/ct), UTI (no indwelling lines, urine with mild leuks no nitrites), gastroenteritis (no episodes of diarrhea per home facility), soft tissue/skin infection (no signs of breakdown, ulceration or rashes). Likely component of dehydration. Possible early signs of infection that has not yet declared itself. - continue broad-spectrum abx, trend fever curve  -f/up cultures (blood, urine), repeat CBC 4/12 -monitor for changes in clinical exam  -continue IVF for hydration, now -Tylenol prn   #Hyperthyroidism: enlargement of thyroid on CT -repeat TSH, T4 WNL -cont methimazole   #Hyperlipidemia: no documented lipid panel; LFTs wnl  -cont home dose statin: lipitor 20   #Dementia: Pt non verbal at baseline. Withdraws limbs to painful stimuli, will open eyes to movements; severe contractures; no signs of skin break down or bruising.  -cont Celexa  -avoid sedating medications  -consider very low doses haldol prn if needed for acute delirium; will not schedule at this time  -bed turns to avoid pressure ulcers  -will contact daughter; may  consider palliative care consult for specific GOC, etc  #FEN/GI: clinically appeared dehydrated on presentation likely 2/2 above but no signs of increased insensible losses -normal swallow despite dementia; SLP recommends close discussion with family re: risks of aspiration, etc  -reduce IVF to 75 mL/h -senna 17.54m  PPx: subQ heparin  Disposition: management as above; discharge pending stability / resolution of fevers and transition to PO agents, and discussion with daughter about long-term expectations / potential specific GOC meeting -SW consulted due to admission from SNF with likely discharge back once medically ready  Subjective: Pt remains nonverbal. No acute events overnight per nursing. No distress and no O2 requirement, though some mild fevers, per RN.  Objective: Temp:  [99.3 F (37.4 C)-100.8 F (38.2 C)] 100.7 F (38.2 C) (04/11 0515) Pulse Rate:  [76-103] 87 (04/11 0515) Resp:  [18-30] 20 (04/11 0515) BP: (117-140)/(33-47) 127/47 mmHg (04/11 0515) SpO2:  [92 %-100 %] 92 % (04/11 0515) Weight:  [114 lb 10.2 oz (52 kg)] 114 lb 10.2 oz (52 kg) (04/10 1559) Physical Exam: Gen: elderly adult female, sleeping but rouses to painful stimuli, nonverbal, in NAD HEENT: NCAT, unable to assess EOM or pupils Neck: supple, no lymphadenopathy  CV: RRR, good S1/S2, no murmur, cap refill <3  Resp: normal WOB on room air, scattered crackles in dependent left lung base, but otherwise normal Abd: soft, not obviously tender, BS+ Ext: No edema, warm, bilat extremity contractures  Neuro: non verbal, withdraws to painful stimuli Skin: no gross rashes noted or or evidence of skin breakdown to extremities / sacral region  Laboratory:  Recent Labs Lab 10/01/13 1002 10/02/13 0745  WBC 5.8 5.0  HGB 12.3 12.2  HCT 37.0 37.2  PLT 97*  91*    Recent Labs Lab 10/01/13 1002 10/02/13 0745  NA 137 133*  K 4.1 3.6*  CL 100 98  CO2 24 20  BUN 14 10  CREATININE 0.71 0.65  CALCIUM 8.4  8.0*  PROT 6.0 5.7*  BILITOT 1.7* 1.8*  ALKPHOS 65 58  ALT 11 17  AST 14 32  GLUCOSE 114* 87   TSH 4/10: 0.715 Free T4 4/10: 1.20  Micro: Urine cx, 4/10: PENDING Blood cx, 4/10: PENDING  Imaging/Diagnostic Tests: EKG 4/10 - sinus rhythm, biatrial enlargement, prolong qt  CXR 4/10 - question of mediastinal adenopathy  CT chest 4/10 - no mediastinal adenopathy noted, but thyroid enlarged  Emmaline Kluver, MD 10/02/2013, 11:51 AM PGY-2, [place] Intern pager: [phone], text pages welcome

## 2013-10-03 ENCOUNTER — Inpatient Hospital Stay (HOSPITAL_COMMUNITY): Payer: Medicare Other

## 2013-10-03 DIAGNOSIS — R7881 Bacteremia: Secondary | ICD-10-CM

## 2013-10-03 DIAGNOSIS — B9689 Other specified bacterial agents as the cause of diseases classified elsewhere: Secondary | ICD-10-CM

## 2013-10-03 LAB — CULTURE, BLOOD (ROUTINE X 2)

## 2013-10-03 LAB — VANCOMYCIN, TROUGH: Vancomycin Tr: 6.4 ug/mL — ABNORMAL LOW (ref 10.0–20.0)

## 2013-10-03 MED ORDER — SODIUM CHLORIDE 0.45 % IV SOLN
INTRAVENOUS | Status: DC
  Administered 2013-10-03 – 2013-10-05 (×3): via INTRAVENOUS

## 2013-10-03 MED ORDER — VANCOMYCIN HCL IN DEXTROSE 750-5 MG/150ML-% IV SOLN
750.0000 mg | Freq: Three times a day (TID) | INTRAVENOUS | Status: DC
  Administered 2013-10-04: 750 mg via INTRAVENOUS
  Filled 2013-10-03 (×2): qty 150

## 2013-10-03 MED ORDER — CHLORHEXIDINE GLUCONATE 0.12 % MT SOLN
15.0000 mL | Freq: Two times a day (BID) | OROMUCOSAL | Status: DC
  Administered 2013-10-03 – 2013-10-04 (×2): 15 mL via OROMUCOSAL
  Filled 2013-10-03 (×2): qty 15

## 2013-10-03 MED ORDER — BIOTENE DRY MOUTH MT LIQD
15.0000 mL | Freq: Two times a day (BID) | OROMUCOSAL | Status: DC
  Administered 2013-10-03: 15 mL via OROMUCOSAL

## 2013-10-03 NOTE — Progress Notes (Signed)
Family Medicine Teaching Service Daily Progress Note Intern Pager: [phone]  Patient name: Desiree Arias Medical record number: [id number] Date of birth: 1931-08-17 Age: 80 y.o. Gender: female  Primary Care Provider: Scarlette Calico, MD Consultants: none; consider palliative Code Status: DNR  Pt Overview and Major Events to Date: Desiree Arias is a 80 y.o. female presenting with fever to 102.8 with unknown source. PMH is significant for severe dementia (non verbal), hyperlipidemia, hyperthyroidism. 4/10: admitted; met SIRS criteria, though vitals improved by arrival to the ED, started on vanc, Zosyn 4/11: remains nonverbal, intermittent fevers to 100.8; blood Cx show Gram POSITIVE cocci in clusters 4/12: fevers resolved, hemodynamically stable on Vanc/Zosyn 4/13: coag neg staph, d'c broad abx  Assessment and Plan:  Desiree Arias is a 80 y.o. female presenting with fever to 102.8 with unknown source. PMH is significant for dementia (non verbal), hyperlipidemia, hyperthyroidism  #Fever of unknown origin: Bcx now showing Coag neg staph; likely contaminant vs true bacteremia, althought initially met SIRS criteria at [facility], fever curve improved. Unknown source but suspect viral infection.  -d'c abx today and trend fever curve  -monitor for changes in clinical exam  -continue IVF for hydration, now, encourage PO intake (severely demented but will eat/drink when offered) -Tylenol PRN for fever  #Hyperthyroidism: enlargement of thyroid on CT -repeat TSH, T4 WNL -continue methimazole   #Hyperlipidemia: no documented lipid panel; LFTs wnl  -continue home dose statin: lipitor 20   #Dementia: Pt non verbal at baseline; Withdraws limbs to painful stimuli, will open eyes to movements; severe contractures; no signs of skin break down or bruising.  -palliative care consult pending for GOC, to discuss MOST form, etc -continue Celexa, avoid sedating medications  -consider very low doses haldol prn  if needed for acute delirium; will not schedule at this time  -bed turns to avoid pressure ulcers   #FEN/GI: clinically appeared dehydrated on presentation likely 2/2 above but no signs of increased insensible losses -normal swallow despite dementia; SLP recommends close discussion with family re: risks of aspiration, etc  -continue IVF 1/2NS _0  mL/h to support PO intake -senna 17.83m  PPx: subQ heparin  Disposition: management as above; discharge pending stability off abx; discussion with daughter about long-term expectations /palliative consulted this weekend; back to ALF when medically ready  Subjective: Sitting up in bed, watching Tv, non verbal at baseline; no concerns from nursing at this time  Objective: Temp:  [97.6 F (36.4 C)-99.3 F (37.4 C)] 97.6 F (36.4 C) (04/12 1415) Pulse Rate:  [79-83] 79 (04/12 1415) Resp:  [16-20] 16 (04/12 1415) BP: (114-145)/(47-72) 122/72 mmHg (04/12 1415) SpO2:  [96 %] 96 % (04/12 1415) Weight:  [125 lb 14.1 oz (57.1 kg)] 125 lb 14.1 oz (57.1 kg) (04/12 0900) Physical Exam: Gen: elderly adult female, nonverbal, in NAD, sitting up watching tv HEENT: NCAT, pupils ERRL, unable to assess EOMI Neck: supple, no lymphadenopathy  CV: RRR, good S1/S2, no murmur, cap refill <3  Resp: normal WOB on room air, scattered crackles in dependent left lung base, but otherwise normal Abd: soft, not obviously tender, BS+ Ext: No edema, warm, bilat extremity contractures  Neuro: non verbal, withdraws to painful stimuli Skin: no gross rashes noted or or evidence of skin breakdown to extremities / sacral region  Laboratory:  Recent Labs Lab 10/01/13 1002 10/02/13 0745  WBC 5.8 5.0  HGB 12.3 12.2  HCT 37.0 37.2  PLT 97* 91*    Recent Labs Lab 10/01/13 1002 10/02/13 0745  NA 137  133*  K 4.1 3.6*  CL 100 98  CO2 24 20  BUN 14 10  CREATININE 0.71 0.65  CALCIUM 8.4 8.0*  PROT 6.0 5.7*  BILITOT 1.7* 1.8*  ALKPHOS 65 58  ALT 11 17  AST 14 32   GLUCOSE 114* 87   TSH 4/10: 0.715 Free T4 4/10: 1.20  Micro: Urine cx, 4/10: PENDING Blood cx, 4/10: Gram POSITIVE cocci in clusters  Imaging/Diagnostic Tests: EKG 4/10 - sinus rhythm, biatrial enlargement, prolong qt  CXR 4/10 - question of mediastinal adenopathy  CT chest 4/10 - no mediastinal adenopathy noted, but thyroid enlarged  Langston Masker, MD 10/03/2013, 9:14 PM PGY-1, [place] Intern pager: [phone], text pages welcome

## 2013-10-03 NOTE — Progress Notes (Signed)
I have seen and examined this patient. I have discussed with Dr Street.  I agree with their findings and plans as documented in their progress note.    

## 2013-10-03 NOTE — Progress Notes (Signed)
Family Medicine Teaching Service Daily Progress Note Intern Pager: [phone]  Patient name: Myracle Febres Medical record number: [id number] Date of birth: [birth year] Age: 80 y.o. Gender: female  Primary Care Provider: Scarlette Calico, MD Consultants: none; consider palliative Code Status: DNR  Pt Overview and Major Events to Date: Pattijo Juste is a 80 y.o. female presenting with fever to 102.8 with unknown source. PMH is significant for severe dementia (non verbal), hyperlipidemia, hyperthyroidism. 4/10: admitted; met SIRS criteria, though vitals improved by arrival to the ED, started on vanc, Zosyn 4/11: remains nonverbal, intermittent fevers to 100.8; blood Cx show Gram POSITIVE cocci in clusters 4/12: fevers resolved, hemodynamically stable on Vanc/Zosyn  Assessment and Plan:   #Fever in setting of Gram positive bacteremia: Blood culture results not yet finalized, fevers improved. - initially met SIRS criteria at [facility] though SIRS resolved on presentation to the ED. - DDx broad, includes URI, UTI , gastroenteritis, soft tissue/skin infection. Likely component of dehydration. - continue broad-spectrum abx, trend fever curve  -f/up cultures (blood, urine), pending CBC -monitor for changes in clinical exam  -continue IVF for hydration, now, encourage PO intake (severely demented but will eat/drink when offered) -Tylenol PRN for fever  #Hyperthyroidism: enlargement of thyroid on CT -repeat TSH, T4 WNL -continue methimazole   #Hyperlipidemia: no documented lipid panel; LFTs wnl  -continue home dose statin: lipitor 20   #Dementia: Pt non verbal at baseline; see note 4/12 by Dr. Venetia Maxon re: discussion with daughter. Withdraws limbs to painful stimuli, will open eyes to movements; severe contractures; no signs of skin break down or bruising.  -palliative care consult pending for GOC, to discuss MOST form, etc -continue Celexa, avoid sedating medications  -consider very low doses  haldol prn if needed for acute delirium; will not schedule at this time  -bed turns to avoid pressure ulcers   #FEN/GI: clinically appeared dehydrated on presentation likely 2/2 above but no signs of increased insensible losses -normal swallow despite dementia; SLP recommends close discussion with family re: risks of aspiration, etc  -continue IVF to 75 mL/h to support PO intake, but switch to 1/2NS -senna 17.52m PPx: subQ heparin  Disposition: management as above; discharge pending stability / resolution of fevers and transition to PO agents, and discussion with daughter about long-term expectations / potential specific GOC meeting -SW consulted due to admission from SNF with likely discharge back once medically ready  Subjective: Pt remains nonverbal. No acute events overnight per nursing. Fevers improved. No distress and no O2 requirement. Eating breakfast without issue with help of nurse tech.  Objective: Temp:  [98.4 F (36.9 C)-99.8 F (37.7 C)] 98.4 F (36.9 C) (04/12 0516) Pulse Rate:  [79-83] 79 (04/12 0516) Resp:  [18-20] 20 (04/12 0516) BP: (114-147)/(47-70) 114/47 mmHg (04/12 0516) SpO2:  [96 %] 96 % (04/12 0516) Physical Exam: Gen: elderly adult female, nonverbal, in NAD, sitting up eating breakfast with assistance HEENT: NCAT, unable to assess EOM or pupils Neck: supple, no lymphadenopathy  CV: RRR, good S1/S2, no murmur, cap refill <3  Resp: normal WOB on room air, scattered crackles in dependent left lung base, but otherwise normal Abd: soft, not obviously tender, BS+ Ext: No edema, warm, bilat extremity contractures  Neuro: non verbal, withdraws to painful stimuli Skin: no gross rashes noted or or evidence of skin breakdown to extremities / sacral region  Laboratory:  Recent Labs Lab 10/01/13 1002 10/02/13 0745  WBC 5.8 5.0  HGB 12.3 12.2  HCT 37.0 37.2  PLT 97* 91*  Recent Labs Lab 10/01/13 1002 10/02/13 0745  NA 137 133*  K 4.1 3.6*  CL 100 98   CO2 24 20  BUN 14 10  CREATININE 0.71 0.65  CALCIUM 8.4 8.0*  PROT 6.0 5.7*  BILITOT 1.7* 1.8*  ALKPHOS 65 58  ALT 11 17  AST 14 32  GLUCOSE 114* 87   TSH 4/10: 0.715 Free T4 4/10: 1.20  Micro: Urine cx, 4/10: PENDING Blood cx, 4/10: Gram POSITIVE cocci in clusters  Imaging/Diagnostic Tests: EKG 4/10 - sinus rhythm, biatrial enlargement, prolong qt  CXR 4/10 - question of mediastinal adenopathy  CT chest 4/10 - no mediastinal adenopathy noted, but thyroid enlarged  Emmaline Kluver, MD 10/03/2013, 9:11 AM PGY-2, [place] Intern pager: [phone], text pages welcome

## 2013-10-03 NOTE — Progress Notes (Signed)
Pt's family would like for [facility] to return [facility] where [facility] lives, tomorrow if medically possible.

## 2013-10-03 NOTE — Progress Notes (Signed)
ANTIBIOTIC CONSULT NOTE - FOLLOW UP  Pharmacy Consult for vancomycin Indication: fever of unknown origin  Allergies  Allergen Reactions  . Milk-Related Compounds     Patient Measurements: Height: 5\' 4"  (162.6 cm) Weight: 125 lb 14.1 oz (57.1 kg) IBW/kg (Calculated) : 54.7  Vital Signs: Temp: 97.6 F (36.4 C) (04/12 1415) Temp src: Axillary (04/12 1415) BP: 122/72 mmHg (04/12 1415) Pulse Rate: 79 (04/12 1415) Intake/Output from previous day: 04/11 0701 - 04/12 0700 In: 945 [P.O.:120; I.V.:675; IV Piggyback:150] Out: -  Intake/Output from this shift: Total I/O In: 480 [P.O.:480] Out: -   Labs:  Recent Labs  10/01/13 1002 10/02/13 0745  WBC 5.8 5.0  HGB 12.3 12.2  PLT 97* 91*  CREATININE 0.71 0.65   Estimated Creatinine Clearance: 49.2 ml/min (by C-G formula based on Cr of 0.65).  Recent Labs  10/03/13 1640  VANCOTROUGH 6.4*     Microbiology: Recent Results (from the past 720 hour(s))  CULTURE, BLOOD (ROUTINE X 2)     Status: None   Collection Time    10/01/13 10:02 AM      Result Value Ref Range Status   Specimen Description BLOOD RIGHT FOREARM   Final   Special Requests BOTTLES DRAWN AEROBIC AND ANAEROBIC 5CC   Final   Culture  Setup Time     Final   Value: 10/01/2013 16:46     Performed at [facility]   Culture     Final   Value: STAPHYLOCOCCUS SPECIES (COAGULASE NEGATIVE)     Note: THE SIGNIFICANCE OF ISOLATING THIS ORGANISM FROM A SINGLE SET OF BLOOD CULTURES WHEN MULTIPLE SETS ARE DRAWN IS UNCERTAIN. PLEASE NOTIFY THE MICROBIOLOGY DEPARTMENT WITHIN ONE WEEK IF SPECIATION AND SENSITIVITIES ARE REQUIRED.     Note: Gram Stain Report Called to,Read Back By and Verified With: JILL MOORE4/11/15 @ 12:45PM BY RUSCOE A.     Performed at [facility]   Report Status 10/03/2013 FINAL   Final  URINE CULTURE     Status: None   Collection Time    10/01/13 10:22 AM      Result Value Ref Range Status   Specimen Description URINE, CLEAN CATCH    Final   Special Requests NONE   Final   Culture  Setup Time     Final   Value: 10/01/2013 16:29     Performed at [facility]   Colony Count     Final   Value: NO GROWTH     Performed at [facility]   Culture     Final   Value: NO GROWTH     Performed at [facility]   Report Status 10/02/2013 FINAL   Final  CULTURE, BLOOD (ROUTINE X 2)     Status: None   Collection Time    10/01/13  1:52 PM      Result Value Ref Range Status   Specimen Description BLOOD HAND LEFT   Final   Special Requests BOTTLES DRAWN AEROBIC ONLY 3CC   Final   Culture  Setup Time     Final   Value: 10/01/2013 16:46     Performed at [facility]   Culture     Final   Value:        BLOOD CULTURE RECEIVED NO GROWTH TO DATE CULTURE WILL BE HELD FOR 5 DAYS BEFORE ISSUING A FINAL NEGATIVE REPORT     Performed at [facility]   Report Status PENDING   Incomplete  Medical History: Past Medical History  Diagnosis Date  . Dementia   . Senile delirium   . Drug hallucinosis   . Chronic thyroiditis   . Depressive disorder     Assessment: 82 YOF from [facility] with fever of 102.8 and AMS. WBC 5.8, Tmax in ED 100.2. Patient is currently on Vancomycin 500 mg IV Q 12 hours and Zosyn 3.375 gm IV Q 8 hours. A vancomycin trough today was sub-therapeutic at 6.4. Will increase dose today. WBC remains wnl and max temp of 99.40F. CrCl remains stable ~ 50 mL/min.   Goal of Therapy:  Vancomycin trough level 15-20 mcg/ml  Plan:  1. Increase Vancomycin to 750mg  IV q8h 2. Continue Zosyn 3.375g IV q8h  3. Follow renal function closely, clinical progression, c/s, trough at [facility] PharmD.  Clinical Pharmacist Pager [phone]

## 2013-10-04 DIAGNOSIS — Z515 Encounter for palliative care: Secondary | ICD-10-CM

## 2013-10-04 DIAGNOSIS — A419 Sepsis, unspecified organism: Secondary | ICD-10-CM | POA: Diagnosis present

## 2013-10-04 DIAGNOSIS — E0590 Thyrotoxicosis, unspecified without thyrotoxic crisis or storm: Secondary | ICD-10-CM

## 2013-10-04 DIAGNOSIS — R509 Fever, unspecified: Principal | ICD-10-CM

## 2013-10-04 DIAGNOSIS — F0390 Unspecified dementia without behavioral disturbance: Secondary | ICD-10-CM

## 2013-10-04 DIAGNOSIS — N390 Urinary tract infection, site not specified: Secondary | ICD-10-CM

## 2013-10-04 LAB — OCCULT BLOOD, POC DEVICE: Fecal Occult Bld: NEGATIVE

## 2013-10-04 MED ORDER — SORBITOL 70 % SOLN
30.0000 mL | Freq: Every day | Status: DC | PRN
  Filled 2013-10-04: qty 30

## 2013-10-04 MED ORDER — BIOTENE DRY MOUTH MT LIQD
15.0000 mL | Freq: Two times a day (BID) | OROMUCOSAL | Status: DC
  Administered 2013-10-04 (×2): 15 mL via OROMUCOSAL

## 2013-10-04 NOTE — Progress Notes (Signed)
Full note to follow:  I met with Ms. Nakayama's daughter, Joseph Art, who comfirms DNR and focus on comfort. She does not wish for any measures to prolong life at this point as they recognize very poor quality of life and prognosis. DNR completed and MOST completed: DNR, comfort measures with not return to hospital, consider antibiotics, no IV fluids, and no feeding tube. They would like to return to [facility] with hospice support to achieve goals set for comfort on MOST form.  Vinie Sill, NP Palliative Medicine Team Pager # [phone] (M-F 8a-5p) Team Phone # [phone] (Nights/Weekends)

## 2013-10-04 NOTE — Progress Notes (Signed)
FMTS Attending Daily Note:  Renold DonJeff Zunairah Devers MD  [phone] pager  Family Practice pager:  [phone] I have seen and examined this patient and have reviewed their chart. I have discussed this patient with the resident. I agree with the resident's findings, assessment and care plan.  Additionally:  Family at bedside today, requests Palliative Care meeting for possible initiation of Hospice/goals of care.  Blood cx's with coag negative staph.  Likely contaminant.  However, possibility of source of dental caries (rather poor dental hygeine and refuses care dental care/cleaning).  Recommend covering with amoxicillin as possible source if fever returns.    Plan is to contact palliative care for hopeful meeting today as daughter is HCPOA and must return to [place] this evening.  Will touch base with palliative to attempt to facilitate this meeting.   Tobey GrimJeffrey H Refugio Vandevoorde, MD 10/04/2013 9:36 AM

## 2013-10-04 NOTE — Progress Notes (Signed)
Agree with dietetic intern note. No interventions warranted at this time. Please consult RD if nutrition needs arise. Ian Malkineanne Barnett RD, LDN Inpatient Clinical Dietitian Pager: [phone] After Hours Pager: [phone]

## 2013-10-04 NOTE — Consult Note (Signed)
Patient Desiree Arias      DOB: Feb 01, 1932      LZJ:[id number]     Consult Note from the Palliative Medicine Team at [facility] Requested by: Dr. McDiarmid     PCP: Scarlette Calico, MD Reason for Consultation: [place] and options     Phone Number:[phone]  Assessment of patients Current state: Desiree Arias is a 80 yo female with advanced dementia, hyperthyroidism, hyperlipidemia, and admitted with fever of unknown origin. Blood cultures likely contaminated and viral infection suspected to be source as fever has improved antibiotics d/c today. Return to [facility] anticipated for tomorrow.   I met today with Desiree Arias's daughter, Zadie Rhine [phone], [phone]), also HCPOA about GOC. We discussed her mother's declining health status and Joseph Art tells me that this all began in 2008 and has progressed quickly since [place]. Desiree Arias is wheelchair bound, unable to feed herself (although eats most meals ~75% or better although has had a 12 lb weight loss since Oct), pureed diet from pocketing food, and has been mostly nonverbal for ~1 yr. Joseph Art says that her mother used to say "oh yeah" when given a cookie or dessert but the past few months has said nothing and has become less expressive although she does track with her eyes. Joseph Art says her mother is from [place] and used to visit over the summer for 2-3 months and loved to travel the world. She worked Veterinary surgeon at [employer]. ~20 yrs and was very sociable so Joseph Art says this is no quality of life for her. She says that her brother and her are in agreement for comfort measures without return to the hospital as they do not wish any measures to prolong her life at this point. MOST was completed: DNR, comfort measures (NO hospital readmission), consider antibiotics, no IV fluids, and no feeding tube. She wishes for hospice to help them achieve these goals at [facility].    Goals of Care: 1.  Code Status: DNR   2. Scope  of Treatment: Comfort measures is priority. No life prolonging therapies d/t poor quality of life and poor overall prognosis.   4. Disposition: Return to [facility] with hospice support.    3. Symptom Management:   1. Anxiety/Agitation: no signs 2. Pain: no signs 3. Bowel Regimen: Senna scheduled. Sorbitol prn.  4. Fever: Acetaminophen prn.   4. Psychosocial: Emotional support provided to patient and daughter.    Patient Documents Completed or Given: Document Given Completed  Advanced Directives Pkt    MOST  yes  DNR  yes  Gone from My Sight    Hard Choices      Brief HPI: 80 yo female with advanced dementia, hyperthyroidism, hyperlipidemia, and admitted with fever of unknown origin. Blood cultures likely contaminated and viral infection suspected to be source as fever has improved antibiotics d/c today. Return to [facility] anticipated for tomorrow.    ROS: Unable to elicit - confused.     PMH:  Past Medical History  Diagnosis Date  . Dementia   . Senile delirium   . Drug hallucinosis   . Chronic thyroiditis   . Depressive disorder      DQQ:IWLNLGX reviewed. No pertinent past surgical history. I have reviewed the [place] and SH and  If appropriate update it with new information. Allergies  Allergen Reactions  . Milk-Related Compounds    Scheduled Meds: . antiseptic oral rinse  15 mL Mouth Rinse q12n4p  . atorvastatin  20 mg Oral QHS  . chlorhexidine  15 mL Mouth Rinse BID  . cholecalciferol  1,000 Units Oral Daily  . citalopram  20 mg Oral Daily  . heparin  5,000 Units Subcutaneous 3 times per day  . methimazole  5 mg Oral Daily  . senna  2 tablet Oral BID   Continuous Infusions: . sodium chloride 75 mL/hr at 10/03/13 1313   PRN Meds:.acetaminophen    BP 165/59  Pulse 86  Temp(Src) 98.4 F (36.9 C) (Oral)  Resp 16  Ht '5\' 4"'  (1.626 m)  Wt 57.1 kg (125 lb 14.1 oz)  BMI 21.60 kg/m2  SpO2 96%   PPS: 30% at best   Intake/Output Summary  (Last 24 hours) at 10/04/13 1240 Last data filed at 10/04/13 0900  Gross per 24 hour  Intake 2008.75 ml  Output      0 ml  Net 2008.75 ml   LBM: prior to admission                        Physical Exam:  General: NAD, ill appearing, awake HEENT:  [place]/AT, left eye closed, no JVD, tongue moist without oral exudate noted (exam limited) Chest: CTA throughout, decreased bases, no labored breathing CVS: RRR, S1 S2 Abdomen: Soft, NT, ND, +BS Ext: No edema, contractures, warm to touch Neuro: Awake, unable to follow commands, nonverbal  Labs: CBC    Component Value Date/Time   WBC 5.0 10/02/2013 0745   RBC 4.12 10/02/2013 0745   HGB 12.2 10/02/2013 0745   HCT 37.2 10/02/2013 0745   PLT 91* 10/02/2013 0745   MCV 90.3 10/02/2013 0745   MCH 29.6 10/02/2013 0745   MCHC 32.8 10/02/2013 0745   RDW 12.7 10/02/2013 0745   LYMPHSABS 0.7 10/02/2013 0745   MONOABS 0.4 10/02/2013 0745   EOSABS 0.0 10/02/2013 0745   BASOSABS 0.0 10/02/2013 0745    BMET    Component Value Date/Time   NA 133* 10/02/2013 0745   K 3.6* 10/02/2013 0745   CL 98 10/02/2013 0745   CO2 20 10/02/2013 0745   GLUCOSE 87 10/02/2013 0745   BUN 10 10/02/2013 0745   CREATININE 0.65 10/02/2013 0745   CALCIUM 8.0* 10/02/2013 0745   GFRNONAA 82* 10/02/2013 0745   GFRAA >90 10/02/2013 0745    CMP     Component Value Date/Time   NA 133* 10/02/2013 0745   K 3.6* 10/02/2013 0745   CL 98 10/02/2013 0745   CO2 20 10/02/2013 0745   GLUCOSE 87 10/02/2013 0745   BUN 10 10/02/2013 0745   CREATININE 0.65 10/02/2013 0745   CALCIUM 8.0* 10/02/2013 0745   PROT 5.7* 10/02/2013 0745   ALBUMIN 3.1* 10/02/2013 0745   AST 32 10/02/2013 0745   ALT 17 10/02/2013 0745   ALKPHOS 58 10/02/2013 0745   BILITOT 1.8* 10/02/2013 0745   GFRNONAA 82* 10/02/2013 0745   GFRAA >90 10/02/2013 0745     Time In Time Out Total Time Spent with Patient Total Overall Time  1120 1245 43mn 848m    Greater than 50%  of this time was spent counseling and coordinating care  related to the above assessment and plan.  AlVinie SillNP Palliative Medicine Team Pager # [phone]-F 8a-5p) Team Phone # [phone]/Weekends)

## 2013-10-04 NOTE — Clinical Social Work Psychosocial (Signed)
Clinical Social Work Department BRIEF PSYCHOSOCIAL ASSESSMENT 10/04/2013  Patient:  Desiree Arias,Desiree Arias     Account Number:  [id number]     Admit date:  10/01/2013  Clinical Social Worker:  Sherre LainSUMMERVILLE,EMILY, LCSWA  Date/Time:  10/04/2013 10:58 AM  Referred by:  Physician  Date Referred:  10/04/2013 Referred for  ALF Placement   Other Referral:   none.   Interview type:  Family Other interview type:   CSW spoke with pt's daughter, Desiree Arias, regarding discharge disposition.    PSYCHOSOCIAL DATA Living Status:  FACILITY Admitted from facility:  [facility] Level of care:  Assisted Living Primary support name:  Desiree Arias Primary support relationship to patient:  CHILD, ADULT Degree of support available:   Strong support system.    CURRENT CONCERNS Current Concerns  Post-Acute Placement   Other Concerns:   none.    SOCIAL WORK ASSESSMENT / PLAN CSW received consult for ALF placement upon medical stability. [facility] with family requesting for pt to return. CSW spoke with pt's daughter, Desiree Arias, regarding discharge disposition. [name] confirmed family's wish for [facility] to [facility] upon discharge.    CSW has contacted admissions liaison with [facility] and will continue to assist with discharge planning needs.   Assessment/plan status:  Psychosocial Support/Ongoing Assessment of Needs Other assessment/ plan:   none.   Information/referral to community resources:   [facility].    PATIENTS/FAMILYS RESPONSE TO PLAN OF CARE: [name] understanding and agreeable to CSW plan of care. [name] requests CSW contact her on day of discharge as Desiree Arias lives out of town.       Darlyn ChamberEmily [place], LCSWA Clinical Social Worker [phone]

## 2013-10-04 NOTE — Progress Notes (Signed)
Brief Nutrition Note   RD drawn to patient for Low Braden Score  Ht: 5'4" Wt: 125 lb BMI: 21.4   Diet order: Dysphagia 1   Pt is a 80 y.o. Female presenting with fever of unknown origin. PMH is significant for dementia (non verbal), hyperlipidemia, hyperthyroidism.   Pt is not currently on any oral nutrition supplements and is eating well per chart. Pt is nonverbal so I am unable to get an accurate dietary recall and weight history. Per chart, pt is at a normal weight and has not lost any significant weight recently. Per resident's note, daughter explained that pt has had some decreased PO intake last week, however, since being admitted she has had good PO intake (eating 90%, per chart) .   Low Sodium and Potassium.  Labs and medications reviewed.   No intervention needed at this time.   Desiree Arias, BS Nutrition Intern Pager: [phone]

## 2013-10-04 NOTE — Clinical Social Work Note (Signed)
Clinical [facility].  Darlyn ChamberEmily [place], LCSWA Clinical Social Worker [phone]

## 2013-10-05 NOTE — Progress Notes (Signed)
[facility].  Desiree had no questions.  I gave my phone number if they have any questions. Vanice Sarahaylor L Thompson

## 2013-10-05 NOTE — Clinical Social Work Note (Signed)
CSW has completed FL2 with addition of discharge medications. [facility] Discharge packet is complete and placed on pt's shadow chart. CSW spoke with pt's daughter, Luster LandsbergRenee, regarding discharge. Luster LandsbergRenee stated family agreeable to discharge and discharge transportation (EMS). CSW has arranged for ambulance transportation via EMS ([facility]).  RN to please call [facility] at [phone]  Darlyn ChamberEmily [place], [facility] HospitalCSWA Clinical Social Worker [phone]

## 2013-10-05 NOTE — Progress Notes (Signed)
[facility], nurse was not answering page and I was asked to leave my number for call back. Desiree Arias

## 2013-10-05 NOTE — Discharge Summary (Signed)
Family Medicine Teaching [facility] Discharge Summary  Patient name: Desiree Arias Medical record number: [id number] Date of birth: 27-Dec-1931 Age: 80 y.o. Gender: female Date of Admission: 10/01/2013  Date of Discharge: 10/05/13 Admitting Physician: Leighton Roachodd D McDiarmid, MD  Primary Care Provider: Sanda Lingerhomas Jones, MD Consultants: none  Indication for Hospitalization: fevers  Discharge Diagnoses/Problem List:  Dementia Fever Hyperthyroid Hyperlipidemia  Disposition: back to ALF  Discharge Condition: stable  Discharge Exam:  BP 166/51  Pulse 80  Temp(Src) 99 F (37.2 C) (Axillary)  Resp 18  Ht 5\' 4"  (1.626 m)  Wt 125 lb 14.1 oz (57.1 kg)  BMI 21.60 kg/m2  SpO2 94% Gen: elderly adult female, nonverbal, in NAD, sleeping quietly HEENT: NCAT, pupils ERRL, eye lid droop at baseline, unable to assess EOMI; poor dentition Neck: supple, no lymphadenopathy  CV: RRR, good S1/S2, no murmur, cap refill <3  Resp: normal WOB on room air, scattered crackles improved Abd: soft, not obviously tender, BS+  Ext: No edema, warm, bilat extremity contractures  Neuro: non verbal, withdraws to painful stimuli  Skin: no gross rashes noted or or evidence of skin breakdown to extremities / sacral region   Brief Hospital Course:  Desiree RaymondRosie Delcastillo is a 80 y.o. female presenting with fever to 102.8 with unknown source. PMH is significant for dementia (non verbal), hyperlipidemia, hyperthyroidism   #Fever of unknown origin: Pt initially meeting SIRS criteria [facility] by tachycardia (>90), fever, and tachypnea. Resolved on presentation to the ED, after being given tylenol by EMS. Was mildly hypotensive and given a 1L bolus with improvement in BP to 130s. CBC, CMP, UA, lactic acid wnl. No white count. (5.8) CXR with some mediastinal adenopathy. CT chest obtained, only showing an enlarged thyroid but no mediastinal mass. Ucx, Bcx obtained. Fever noted to be 100.2. But pt normotensive without tachycardia.  Pt admitted for observation given fever of unknown origin. Started on vanc/zosyn given broad differential including URI (mildly tachypneic with crackles on pulm exam but negative cxr/ct), UTI (no indwelling lines, urine with mild leuks no nitrites), gastro (no episodes of diarrhea per home facility), soft tissue/skin infection (no signs of breakdown, ulceration or rashes). Bcx growing coag neg staph, felt to be likely contaminant. Abx d/c'd. Pt with hx of poor dentition, and possible source of dental caries. However remained afebrile off abx and coverage not restarted. Pt at baseline stable for d'c home to ALF.   #Hyperthyroidism: Enlargement of thyroid on CT (obtained to eval mediastinal adenopathy). TSH/t4 wnl. Cont'd on methimazole   #Hyperlipidemia: No documented lipid panel; LFTs wnl. Continued home dose statin: lipitor 20   #Dementia: Pt non verbal at baseline. Withdraws limbs to painful stimuli, will open eyes to movements; severe contractures; no signs of skin break down or bruising on admission. Continued on celexa, frequent bed turns, night/day schedule. Palliative care consulted for GOC, to discuss MOST form, with son and daughter Avon Gully(HCPOA). Decision made for no return to the hospital, no IVF, no feeding tube, consideration of antibiotics. Pt with life expectancy of <6 months. Hospice appropriate.   #FEN/GI: Clinically appeared dehydrated on presentation likely 2/2 above, no signs of increased insensible losses. Started on IVF 1/2NS@75  initially. Senna 17.2mg . Had a nml swallow despite dementia. SLP recommended close discussion with family re: risks of aspiration. Palliative consulted as above.   Issues for Follow Up:  DNR completed MOST form completed  No return to the hospital, no IV fluids, no feeding tube [facility] with [facility] support  Significant Procedures: None  Significant Labs and Imaging:   Recent Labs Lab 10/01/13 1002 10/02/13 0745  WBC 5.8 5.0  HGB 12.3 12.2  HCT  37.0 37.2  PLT 97* 91*    Recent Labs Lab 10/01/13 1002 10/02/13 0745  NA 137 133*  K 4.1 3.6*  CL 100 98  CO2 24 20  GLUCOSE 114* 87  BUN 14 10  CREATININE 0.71 0.65  CALCIUM 8.4 8.0*  ALKPHOS 65 58  AST 14 32  ALT 11 17  ALBUMIN 3.2* 3.1*    TSH 4/10: 0.715  Free T4 4/10: 1.20  Micro:  Urine cx, 4/10: PENDING  Blood cx, 4/10: Gram POSITIVE cocci in clusters  Imaging/Diagnostic Tests:  EKG 4/10 - sinus rhythm, biatrial enlargement, prolong qt  CXR 4/10 - question of mediastinal adenopathy  CT chest 4/10 - no mediastinal adenopathy noted, but thyroid enlarged   Results/Tests Pending at Time of Discharge: None  Discharge Medications:    Medication List         acetaminophen 500 MG tablet  Commonly known as:  TYLENOL  Take 500 mg by mouth every 6 (six) hours as needed.     atorvastatin 20 MG tablet  Commonly known as:  LIPITOR  Take 20 mg by mouth at bedtime.     citalopram 20 MG tablet  Commonly known as:  CELEXA  Take 20 mg by mouth daily.     methimazole 5 MG tablet  Commonly known as:  TAPAZOLE  Take 5 mg by mouth daily.     senna 8.6 MG Tabs tablet  Commonly known as:  SENOKOT  Take 2 tablets by mouth 2 (two) times daily.     Vitamin D-3 1000 UNITS Caps  Take 1,000 Units by mouth daily.        Discharge Instructions: Please refer to Patient Instructions section of EMR for full details.  Patient was counseled important signs and symptoms that should prompt return to medical care, changes in medications, dietary instructions, activity restrictions, and follow up appointments.   Follow-Up Appointments: Follow-up Information   Follow up with Sanda Lingerhomas Jones, MD. (As needed)    Specialty:  Internal Medicine   Contact information:   [address] [place] [place] [id number] [phone]       Anselm LisMelanie Sarahanne Novakowski, MD 10/05/2013, 12:25 PM PGY-1, [facility]

## 2013-10-05 NOTE — Discharge Instructions (Signed)
[name], you were seen in the hospital for fevers and fast breathing. We looked at your urine, blood, and a chest xray to evaluate for signs of infection. We placed you on high dose antibiotics while we continued to monitor for these infections. We were reassured that everything looked normal. We stopped the antibiotics as it is most likely that you have a viral infection which does not respond to antibacterial agents. We were pleased to see that you did not have any more fevers off these medications. Per discussions with palliative, the plan is to [facility]. The other important thing while be to continue with good dental hygiene to prevent mouth infections

## 2013-10-06 NOTE — Discharge Summary (Signed)
Family Medicine Teaching Service  Discharge Note : Attending Jeff Walden MD Pager [phone] Inpatient Team Pager:  [phone]  I have reviewed this patient and the patient's chart and have discussed discharge planning with the resident at the time of discharge. I agree with the discharge plan as above.    

## 2013-10-07 LAB — CULTURE, BLOOD (ROUTINE X 2): Culture: NO GROWTH

## 2013-10-11 NOTE — Consult Note (Signed)
I have reviewed and discussed the care of this patient in detail with the nurse practitioner including pertinent patient records, physical exam findings and data. I agree with details of this encounter.  

## 2014-03-24 ENCOUNTER — Telehealth (HOSPITAL_BASED_OUTPATIENT_CLINIC_OR_DEPARTMENT_OTHER): Payer: Self-pay | Admitting: Emergency Medicine

## 2014-03-24 ENCOUNTER — Encounter (HOSPITAL_COMMUNITY): Payer: Self-pay | Admitting: Emergency Medicine

## 2014-03-24 ENCOUNTER — Emergency Department (HOSPITAL_COMMUNITY)
Admission: EM | Admit: 2014-03-24 | Discharge: 2014-03-24 | Disposition: A | Discharge status: Home / Self Care | Attending: Emergency Medicine | Admitting: Emergency Medicine

## 2014-03-24 ENCOUNTER — Emergency Department (HOSPITAL_COMMUNITY)

## 2014-03-24 DIAGNOSIS — Z792 Long term (current) use of antibiotics: Secondary | ICD-10-CM | POA: Insufficient documentation

## 2014-03-24 DIAGNOSIS — S0181XA Laceration without foreign body of other part of head, initial encounter: Secondary | ICD-10-CM | POA: Insufficient documentation

## 2014-03-24 DIAGNOSIS — W19XXXA Unspecified fall, initial encounter: Secondary | ICD-10-CM

## 2014-03-24 DIAGNOSIS — Z23 Encounter for immunization: Secondary | ICD-10-CM | POA: Insufficient documentation

## 2014-03-24 DIAGNOSIS — Y9389 Activity, other specified: Secondary | ICD-10-CM | POA: Insufficient documentation

## 2014-03-24 DIAGNOSIS — IMO0002 Reserved for concepts with insufficient information to code with codable children: Secondary | ICD-10-CM

## 2014-03-24 DIAGNOSIS — Y9289 Other specified places as the place of occurrence of the external cause: Secondary | ICD-10-CM | POA: Insufficient documentation

## 2014-03-24 DIAGNOSIS — W06XXXA Fall from bed, initial encounter: Secondary | ICD-10-CM | POA: Insufficient documentation

## 2014-03-24 DIAGNOSIS — Z8639 Personal history of other endocrine, nutritional and metabolic disease: Secondary | ICD-10-CM | POA: Insufficient documentation

## 2014-03-24 DIAGNOSIS — F0390 Unspecified dementia without behavioral disturbance: Secondary | ICD-10-CM | POA: Insufficient documentation

## 2014-03-24 DIAGNOSIS — Z79899 Other long term (current) drug therapy: Secondary | ICD-10-CM | POA: Insufficient documentation

## 2014-03-24 LAB — COMPREHENSIVE METABOLIC PANEL
ALT: 7 U/L (ref 0–35)
ANION GAP: 12 (ref 5–15)
AST: 13 U/L (ref 0–37)
Albumin: 3.6 g/dL (ref 3.5–5.2)
Alkaline Phosphatase: 57 U/L (ref 39–117)
BUN: 14 mg/dL (ref 6–23)
CALCIUM: 8.8 mg/dL (ref 8.4–10.5)
CHLORIDE: 105 meq/L (ref 96–112)
CO2: 25 meq/L (ref 19–32)
CREATININE: 0.7 mg/dL (ref 0.50–1.10)
GFR calc Af Amer: >90 mL/min (ref >90)
GFR, EST NON AFRICAN AMERICAN: 80 mL/min — AB (ref >90)
Glucose, Bld: 98 mg/dL (ref 70–99)
Potassium: 4.2 mEq/L (ref 3.7–5.3)
Sodium: 142 mEq/L (ref 137–147)
Total Bilirubin: 0.9 mg/dL (ref 0.3–1.2)
Total Protein: 6.2 g/dL (ref 6.0–8.3)

## 2014-03-24 LAB — CBC WITH DIFFERENTIAL/PLATELET
Basophils Absolute: 0 10*3/uL (ref 0.0–0.1)
Basophils Relative: 0 % (ref 0–1)
Eosinophils Absolute: 0.1 10*3/uL (ref 0.0–0.7)
Eosinophils Relative: 1 % (ref 0–5)
HCT: 41.1 % (ref 36.0–46.0)
Hemoglobin: 13.1 g/dL (ref 12.0–15.0)
Lymphocytes Relative: 19 % (ref 12–46)
Lymphs Abs: 1.6 10*3/uL (ref 0.7–4.0)
MCH: 28.9 pg (ref 26.0–34.0)
MCHC: 31.9 g/dL (ref 30.0–36.0)
MCV: 90.5 fL (ref 78.0–100.0)
Monocytes Absolute: 0.5 10*3/uL (ref 0.1–1.0)
Monocytes Relative: 6 % (ref 3–12)
Neutro Abs: 5.9 10*3/uL (ref 1.7–7.7)
Neutrophils Relative %: 74 % (ref 43–77)
Platelets: 138 10*3/uL — ABNORMAL LOW (ref 150–400)
RBC: 4.54 MIL/uL (ref 3.87–5.11)
RDW: 12.8 % (ref 11.5–15.5)
WBC: 8.1 10*3/uL (ref 4.0–10.5)

## 2014-03-24 MED ORDER — TETANUS-DIPHTH-ACELL PERTUSSIS 5-2.5-18.5 LF-MCG/0.5 IM SUSP
0.5000 mL | Freq: Once | INTRAMUSCULAR | Status: AC
  Administered 2014-03-24: 0.5 mL via INTRAMUSCULAR
  Filled 2014-03-24: qty 0.5

## 2014-03-24 MED ORDER — LIDOCAINE-EPINEPHRINE-TETRACAINE (LET) SOLUTION
3.0000 mL | Freq: Once | NASAL | Status: AC
  Administered 2014-03-24: 3 mL via TOPICAL
  Filled 2014-03-24: qty 3

## 2014-03-24 NOTE — ED Notes (Signed)
Per EMS [facility] from [facility], staff came in room and found in floor from bed, staff stated was started on antibiotic today for possible UTI, wheelchair bound, pt is nonverbal which is normal for her, R head laceration, EMS states lost about 50cc of blood, pt is not on blood thinner. Bleeding controlled at this time. ccollar in place, KED in place.

## 2014-03-24 NOTE — ED Notes (Signed)
Bed: [id number] Expected date:  Expected time:  Means of arrival:  Comments: Bed 25, EMS, 82 F, Fall, no loc

## 2014-03-24 NOTE — Discharge Instructions (Signed)
Fall Prevention in Hospitals As a hospital patient, your condition and the treatments you receive can increase your risk for falls. Some additional risk factors for falls in a hospital include:  Being in an unfamiliar environment.  Being on bed rest.  Your surgery.  Taking certain medicines.  Your tubing requirements, such as intravenous (IV) therapy or catheters. It is important that you learn how to decrease fall risks while at the hospital. Below are important tips that can help prevent falls. SAFETY TIPS FOR PREVENTING FALLS Talk about your risk of falling.  Ask your caregiver why you are at risk for falling. Is it your medicine, illness, tubing placement, or something else?  Make a plan with your caregiver to keep you safe from falls.  Ask your caregiver or pharmacist about side effect of your medicines. Some medicines can make you dizzy or affect your coordination. Ask for help.  Ask for help before getting out of bed. You may need to press your call button.  Ask for assistance in getting you safely to the toilet.  Ask for a walker or cane to be put at your bedside. Ask that most of the side rails on your bed be placed up before your caregiver leaves the room.  Ask family or friends to sit with you.  Ask for things that are out of your reach, such as your glasses, hearing aids, telephone, bedside table, or call button. Follow these tips to avoid falling:  Stay lying or seated, rather than standing, while waiting for help.  Wear rubber-soled slippers or shoes whenever you walk in the hospital.  Avoid quick, sudden movements.  Change positions slowly.  Sit on the side of your bed before standing.  Stand up slowly and wait before you start to walk.  Let your caregiver know if there is a spill on the floor.  Pay careful attention to the medical equipment, electrical cords, and tubes around you.  When you need help, use your call button by your bed or in the  bathroom. Wait for one of your caregivers to help you.  If you feel dizzy or unsure of your footing, return to bed and wait for assistance.  Avoid being distracted by the TV, telephone, or another person in your room.  Do not lean or support yourself on rolling objects, such as IV poles or bedside tables. Document Released: 06/07/2000 Document Revised: 05/27/2012 Document Reviewed: 02/16/2012 [facility] Patient Information 2015 [place], [place]. This information is not intended to replace advice given to you by your health care provider. Make sure you discuss any questions you have with your health care provider.  Laceration Care, Adult A laceration is a cut that goes through all layers of the skin. The cut goes into the tissue beneath the skin. HOME CARE For stitches (sutures) or staples:  Keep the cut clean and dry.  If you have a bandage (dressing), change it at least once a day. Change the bandage if it gets wet or dirty, or as told by your doctor.  Wash the cut with soap and water 2 times a day. Rinse the cut with water. Pat it dry with a clean towel.  Put a thin layer of medicated cream on the cut as told by your doctor.  You may shower after the first 24 hours. Do not soak the cut in water until the stitches are removed.  Only take medicines as told by your doctor.  Have your stitches or staples removed as told by your  doctor. For skin adhesive strips:  Keep the cut clean and dry.  Do not get the strips wet. You may take a bath, but be careful to keep the cut dry.  If the cut gets wet, pat it dry with a clean towel.  The strips will fall off on their own. Do not remove the strips that are still stuck to the cut. For wound glue:  You may shower or take baths. Do not soak or scrub the cut. Do not swim. Avoid heavy sweating until the glue falls off on its own. After a shower or bath, pat the cut dry with a clean towel.  Do not put medicine on your cut until the glue falls  off.  If you have a bandage, do not put tape over the glue.  Avoid lots of sunlight or tanning lamps until the glue falls off. Put sunscreen on the cut for the first year to reduce your scar.  The glue will fall off on its own. Do not pick at the glue. You may need a tetanus shot if:  You cannot remember when you had your last tetanus shot.  You have never had a tetanus shot. If you need a tetanus shot and you choose not to have one, you may get tetanus. Sickness from tetanus can be serious. GET HELP RIGHT AWAY IF:   Your pain does not get better with medicine.  Your arm, hand, leg, or foot loses feeling (numbness) or changes color.  Your cut is bleeding.  Your joint feels weak, or you cannot use your joint.  You have painful lumps on your body.  Your cut is red, puffy (swollen), or painful.  You have a red line on the skin near the cut.  You have yellowish-white fluid (pus) coming from the cut.  You have a fever.  You have a bad smell coming from the cut or bandage.  Your cut breaks open before or after stitches are removed.  You notice something coming out of the cut, such as wood or glass.  You cannot move a finger or toe. MAKE SURE YOU:   Understand these instructions.  Will watch your condition.  Will get help right away if you are not doing well or get worse. Document Released: 11/27/2007 Document Revised: 09/02/2011 Document Reviewed: 12/04/2010 [facility] Patient Information 2015 [place], [place]. This information is not intended to replace advice given to you by your health care provider. Make sure you discuss any questions you have with your health care provider.

## 2014-03-24 NOTE — ED Notes (Signed)
In to assess patient. Patient is alert, nonverbal. Bleeding to head lac controlled at this time. Patient has C collar and spinal collar in place. NAD at this time.

## 2014-03-24 NOTE — ED Notes (Signed)
2 Unsuccessful attempts at In and Out catheter due to patients contractions. Herbert SetaHeather, PA-C made aware of same.

## 2014-03-24 NOTE — ED Provider Notes (Signed)
CSN: [id number]     Arrival date & time 03/24/14  0026 History   First MD Initiated Contact with Patient 03/24/14 0103     Chief Complaint  Patient presents with  . Fall  . Head Laceration     (Consider location/radiation/quality/duration/timing/severity/associated sxs/prior Treatment) HPI Comments: Patient is an 80 year old female with history of dementia, and drug hallucinosis, chronic thyroiditis who presents to the emergency department today after a fall. [facility] from [facility] where she rolled her from the bed to the floor. She has a laceration to her right head. Staff reports no loss of consciousness. She was recently started on an antibiotic for a urinary tract infection today. Patient is nonverbal and cannot contribute to the history. Per staff at [facility] this is normal for patient.  Patient is a 80 y.o. female presenting with fall and scalp laceration. The history is provided by the patient. No language interpreter was used.  Fall  Head Laceration    Past Medical History  Diagnosis Date  . Dementia   . Senile delirium   . Drug hallucinosis   . Chronic thyroiditis   . Depressive disorder    History reviewed. No pertinent past surgical history. No family history on file. History  Substance Use Topics  . Smoking status: Unknown If Ever Smoked  . Smokeless tobacco: Not on file  . Alcohol Use: No   OB History   Grav Para Term Preterm Abortions TAB SAB Ect Mult Living                 Review of Systems  Unable to perform ROS: Dementia      Allergies  Review of patient's allergies indicates no active allergies.  Home Medications   Prior to Admission medications   Medication Sig Start Date End Date Taking? Authorizing Provider  acetaminophen (TYLENOL) 500 MG tablet Take 500 mg by mouth every 6 (six) hours as needed.   Yes Historical Provider, MD  ciprofloxacin (CIPRO) 250 MG tablet Take 250 mg by mouth 2 (two) times daily. For 7 days   Yes  Historical Provider, MD  Control Gel Formula Dressing (DUODERM CGF DRESSING EX) Apply 1 application topically 2 (two) times daily. To open area on bottom until completely healed   Yes Historical Provider, MD  liver oil-zinc oxide (DESITIN) 40 % ointment Apply 1 application topically 3 (three) times daily.   Yes Historical Provider, MD  senna (SENOKOT) 8.6 MG TABS tablet Take 2 tablets by mouth 2 (two) times daily.   Yes Historical Provider, MD   BP 161/58  Pulse 71  Temp(Src) 97.7 F (36.5 C) (Oral)  Resp 16  SpO2 97% Physical Exam  Nursing note and vitals reviewed. Constitutional: She appears well-developed and well-nourished. No distress.  1 cm laceration to right forehead.   HENT:  Head: Normocephalic.    Right Ear: External ear normal.  Left Ear: External ear normal.  Nose: Nose normal.  Mouth/Throat: Oropharynx is clear and moist.  No broken or loose teeth.  Eyes: Conjunctivae and EOM are normal. Pupils are equal, round, and reactive to light.  Neck: Normal range of motion. No spinous process tenderness and no muscular tenderness present.  Cardiovascular: Normal rate, regular rhythm, normal heart sounds, intact distal pulses and normal pulses.   Pulses:      Radial pulses are 2+ on the right side, and 2+ on the left side.       Posterior tibial pulses are 2+ on the right side, and  2+ on the left side.  Pulmonary/Chest: Effort normal and breath sounds normal. No stridor. No respiratory distress. She has no wheezes. She has no rales.  Abdominal: Soft. She exhibits no distension. There is no tenderness.  Musculoskeletal: Normal range of motion.  No tenderness to palpation over any extremity.   Neurological: She is alert. She has normal strength.  Patient is nonverbal and does not respond to or follow commands Limbs contracted.   Skin: Skin is warm and dry. She is not diaphoretic. No erythema.  Psychiatric: She has a normal mood and affect. Her behavior is normal.    ED  Course  Procedures (including critical care time) Labs Review Labs Reviewed  CBC WITH DIFFERENTIAL - Abnormal; Notable for the following:    Platelets 138 (*)    All other components within normal limits  COMPREHENSIVE METABOLIC PANEL - Abnormal; Notable for the following:    GFR calc non Af Amer 80 (*)    All other components within normal limits  URINE CULTURE  URINALYSIS, ROUTINE W REFLEX MICROSCOPIC    Imaging Review Ct Head Wo Contrast  03/24/2014   CLINICAL DATA:  Found on floor, laceration to right-sided head  EXAM: CT HEAD WITHOUT CONTRAST  CT CERVICAL SPINE WITHOUT CONTRAST  TECHNIQUE: Multidetector CT imaging of the head and cervical spine was performed following the standard protocol without intravenous contrast. Multiplanar CT image reconstructions of the cervical spine were also generated.  COMPARISON:  None.  FINDINGS: CT HEAD FINDINGS  No evidence of parenchymal hemorrhage or extra-axial fluid collection. No mass lesion, mass effect, or midline shift.  No CT evidence of acute infarction.  Subcortical white matter and periventricular small vessel ischemic changes.  Global cortical and central atrophy.  Secondary ventriculomegaly.  The visualized paranasal sinuses are essentially clear. The mastoid air cells are unopacified.  No evidence of calvarial fracture.  CT CERVICAL SPINE FINDINGS  Reversal of the normal cervical lordosis.  No evidence of fracture or dislocation. Vertebral body heights are maintained. Dens appears intact.  No prevertebral soft tissue swelling.  Moderate degenerative changes with partial fusion at C5-6. Facet arthropathy at C4-5.  Thyroid is enlarged with prominent right inferior goiter (series 7/ image 71).  Visualized lung apices are clear.  IMPRESSION: No evidence of acute intracranial abnormality. Atrophy with secondary ventriculomegaly. Small vessel ischemic changes.  No evidence of traumatic injury to the cervical spine. Moderate degenerative changes at C5-6.    Electronically Signed   By: Charline Bills M.D.   On: 03/24/2014 03:02   Ct Cervical Spine Wo Contrast  03/24/2014   CLINICAL DATA:  Found on floor, laceration to right-sided head  EXAM: CT HEAD WITHOUT CONTRAST  CT CERVICAL SPINE WITHOUT CONTRAST  TECHNIQUE: Multidetector CT imaging of the head and cervical spine was performed following the standard protocol without intravenous contrast. Multiplanar CT image reconstructions of the cervical spine were also generated.  COMPARISON:  None.  FINDINGS: CT HEAD FINDINGS  No evidence of parenchymal hemorrhage or extra-axial fluid collection. No mass lesion, mass effect, or midline shift.  No CT evidence of acute infarction.  Subcortical white matter and periventricular small vessel ischemic changes.  Global cortical and central atrophy.  Secondary ventriculomegaly.  The visualized paranasal sinuses are essentially clear. The mastoid air cells are unopacified.  No evidence of calvarial fracture.  CT CERVICAL SPINE FINDINGS  Reversal of the normal cervical lordosis.  No evidence of fracture or dislocation. Vertebral body heights are maintained. Dens appears intact.  No prevertebral  soft tissue swelling.  Moderate degenerative changes with partial fusion at C5-6. Facet arthropathy at C4-5.  Thyroid is enlarged with prominent right inferior goiter (series 7/ image 71).  Visualized lung apices are clear.  IMPRESSION: No evidence of acute intracranial abnormality. Atrophy with secondary ventriculomegaly. Small vessel ischemic changes.  No evidence of traumatic injury to the cervical spine. Moderate degenerative changes at C5-6.   Electronically Signed   By: Charline BillsSriyesh  Krishnan M.D.   On: 03/24/2014 03:02     EKG Interpretation None      LACERATION REPAIR Performed by: Junious SilkMerrell, Cal Gindlesperger Authorized by: Junious SilkMerrell, Chayanne Filippi Consent: Verbal consent obtained. Risks and benefits: risks, benefits and alternatives were discussed Consent given by: patient Patient identity  confirmed: provided demographic data Prepped and Draped in normal sterile fashion Wound explored  Laceration Location: right forehead  Laceration Length: 1 cm  No Foreign Bodies seen or palpated  Anesthesia: local infiltration  Local anesthetic: LET  Irrigation method: syringe Amount of cleaning: standard  Skin closure: 4-0 Ethilon   Number of sutures: 1  Technique: simple interrupted   Patient tolerance: Patient tolerated the procedure well with no immediate complications.   MDM   Final diagnoses:  Fall, initial encounter  Laceration    Patient presents to emergency department after a fall. No consciousness. Head and cervical spine CT are negative for acute abnormality. Laceration to right forehead repaired without complication. TDAP updated. Patient acting at baseline per [facility]. Dr. Rhunette CroftNanavati evaluated patient and agrees with plan. Vital signs stable [facility].     Mora BellmanHannah S Anahis Furgeson, PA-C 03/24/14 [phone]

## 2014-03-25 NOTE — ED Provider Notes (Signed)
Medical screening examination/treatment/procedure(s) were conducted as a shared visit with non-physician practitioner(s) and myself.  I personally evaluated the patient during the encounter.   EKG Interpretation None       DDx includes: - Mechanical falls - ICH - Fractures - Contusions - Soft tissue injury  Un-witnessed fall. Demented patient, unable to give any hx. Appropriate imaging ordered. If neg, will d/c.  Filed Vitals:   03/24/14 0524  BP: 155/73  Pulse: 65  Temp:   Resp: 16     Chayim Bialas, MD 03/25/14 0140

## 2015-11-19 IMAGING — CT CT HEAD W/O CM
3 of 5 series · 18 of 30 positions shown, 19 images · non-contrast
Comparison: None.

CLINICAL DATA: Found on floor, laceration to right-sided head

EXAM:
CT HEAD WITHOUT CONTRAST
CT CERVICAL SPINE WITHOUT CONTRAST
TECHNIQUE: Multidetector CT imaging of the head and cervical spine was
performed following the standard protocol without intravenous
contrast. Multiplanar CT image reconstructions of the cervical spine
were also generated.

[Series 3: head w/o · axial · non-contrast · 0.43mm/px · z∈[-88,-18]mm · 3 of 29 slices shown, 4 images]
[im 8/29  brain]
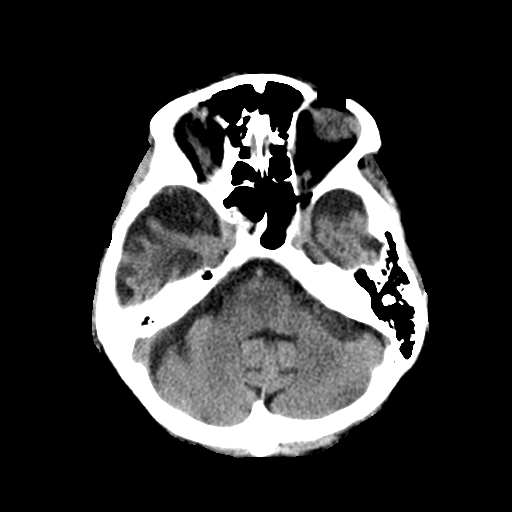
[im 8/29  bone]
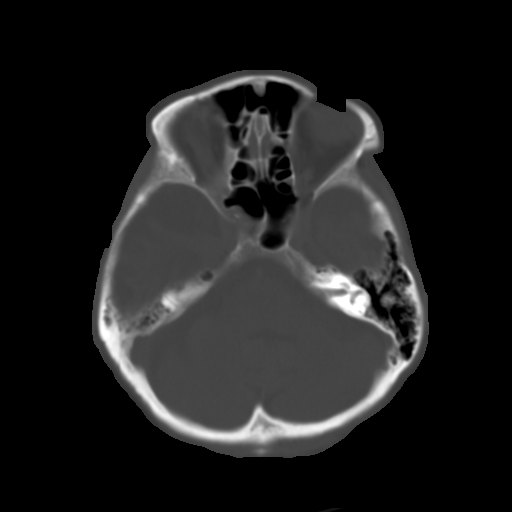
[im 15/29  brain]
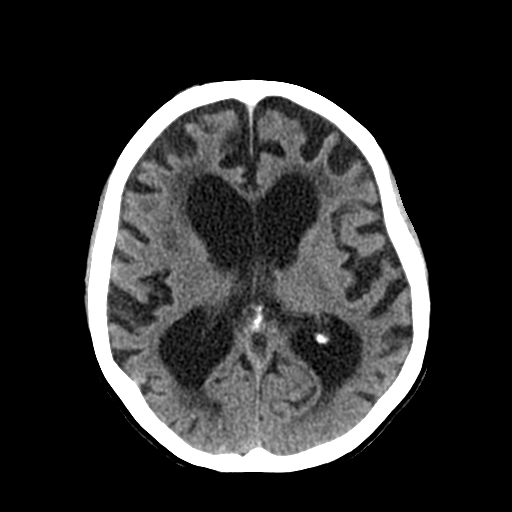
[im 22/29  brain]
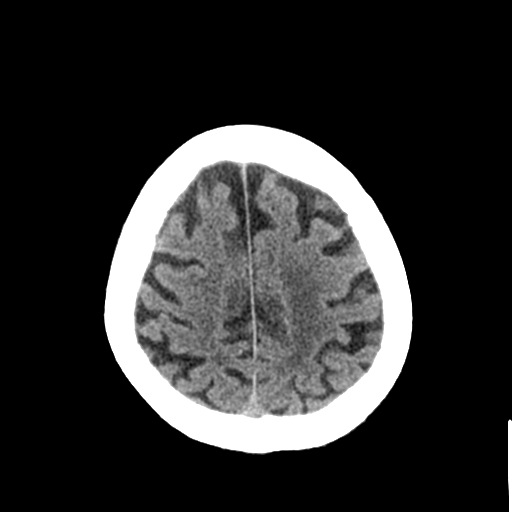

[Series 4: bone windows · axial · 0.43mm/px · z∈[-108,+0]mm · 7 of 48 slices shown]
[im 6/48  bone]
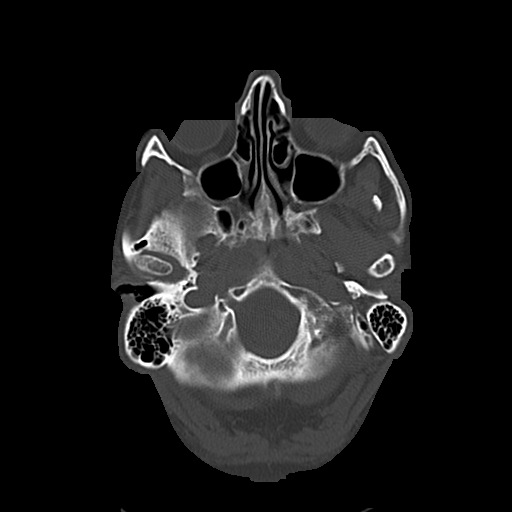
[im 12/48  bone]
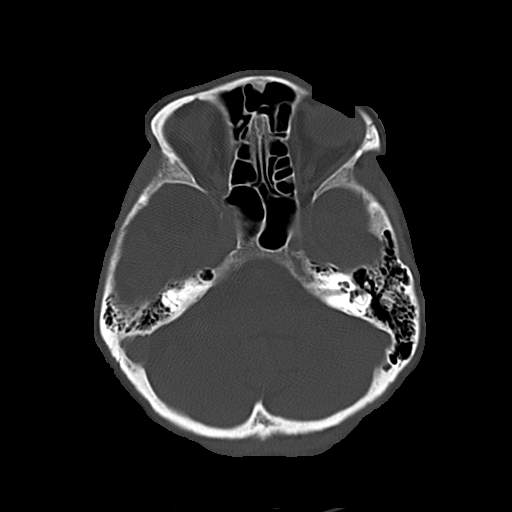
[im 18/48  bone]
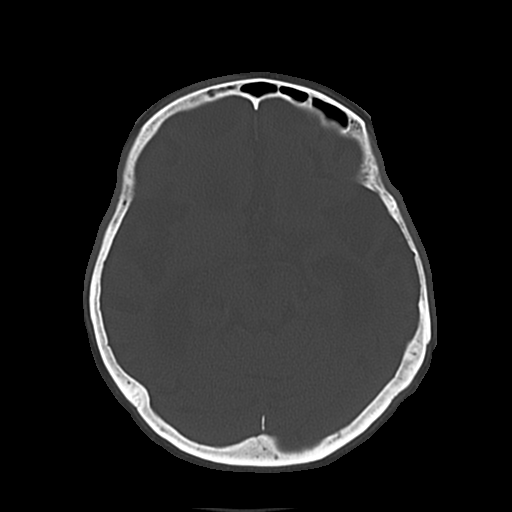
[im 24/48  bone]
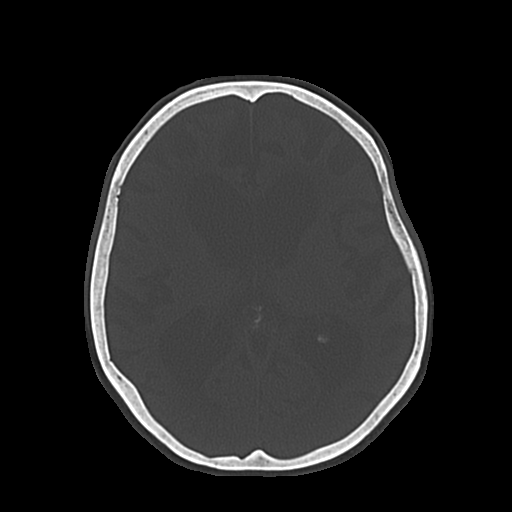
[im 30/48  bone]
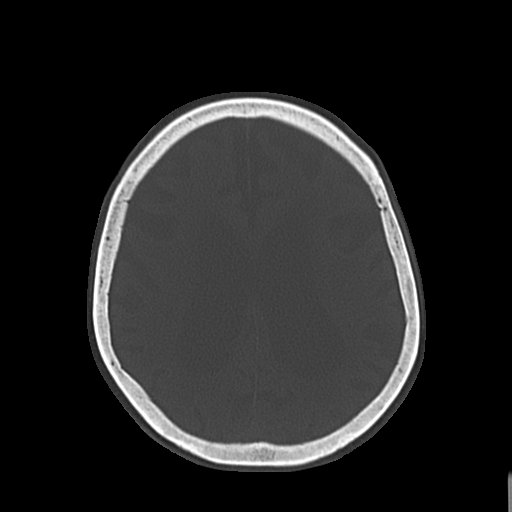
[im 36/48  bone]
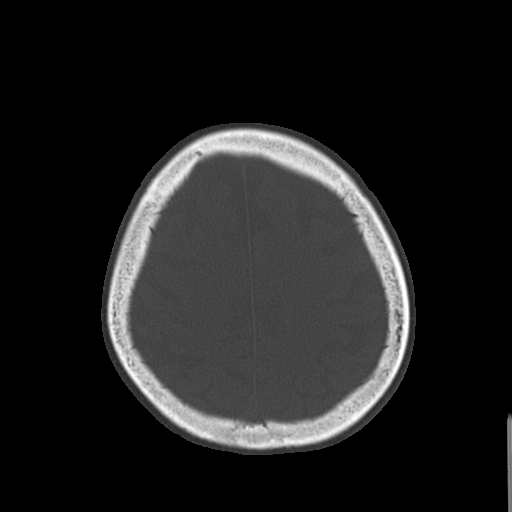
[im 42/48  bone]
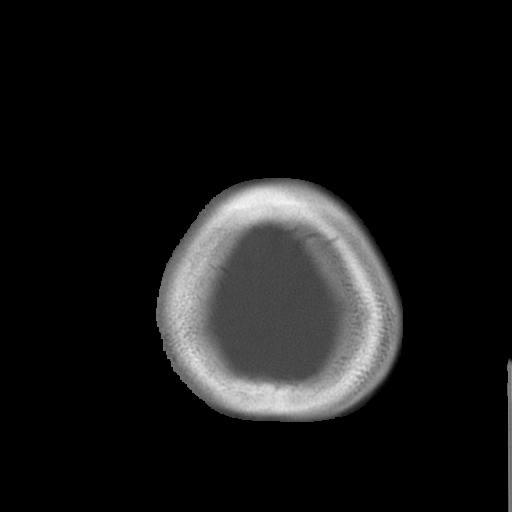

[Series 7: c-spine st · axial · 0.30mm/px · z∈[-242,-124]mm · 8 of 71 slices shown]
[im 6/71  brain]
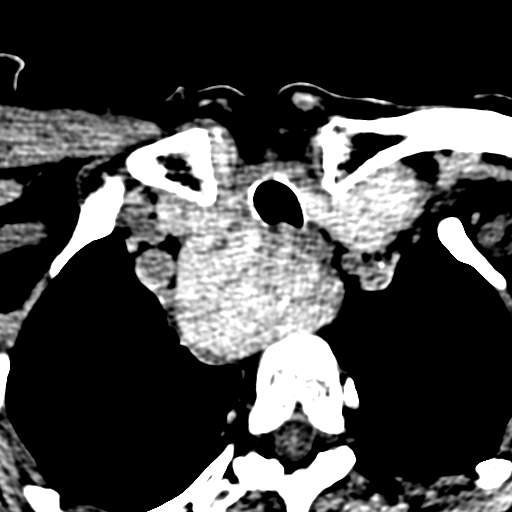
[im 18/71  brain]
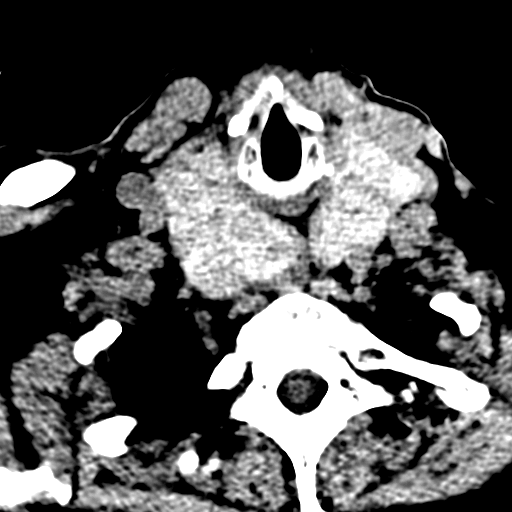
[im 24/71  brain]
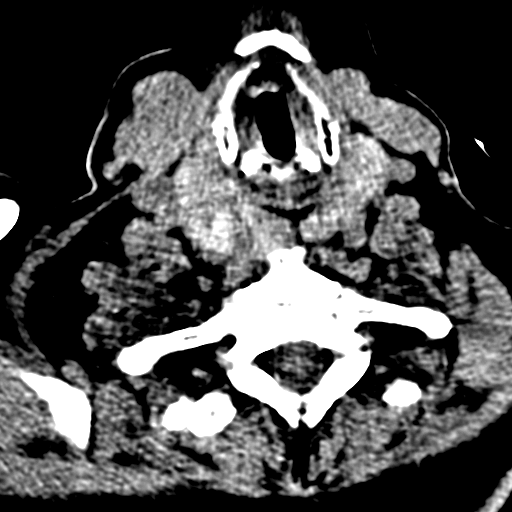
[im 30/71  brain]
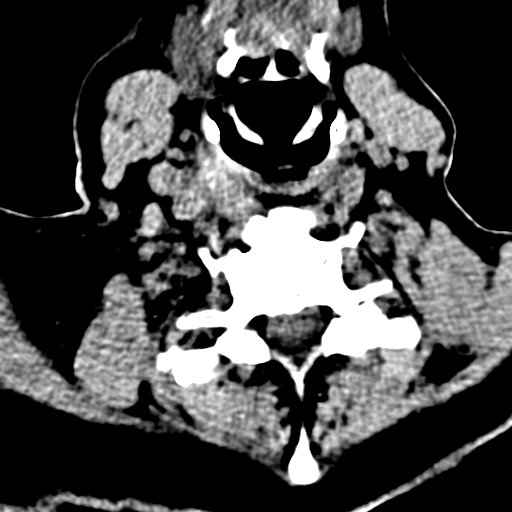
[im 41/71  brain]
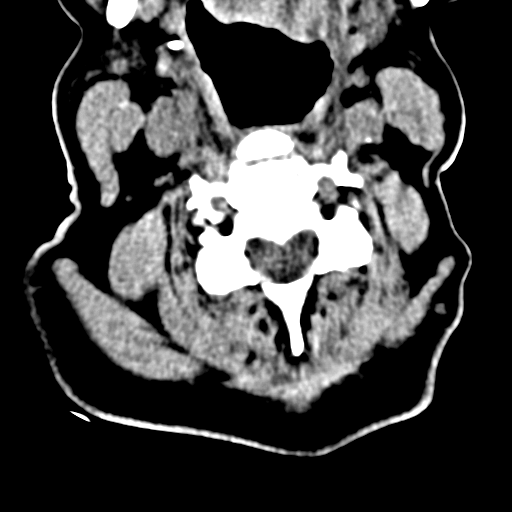
[im 47/71  brain]
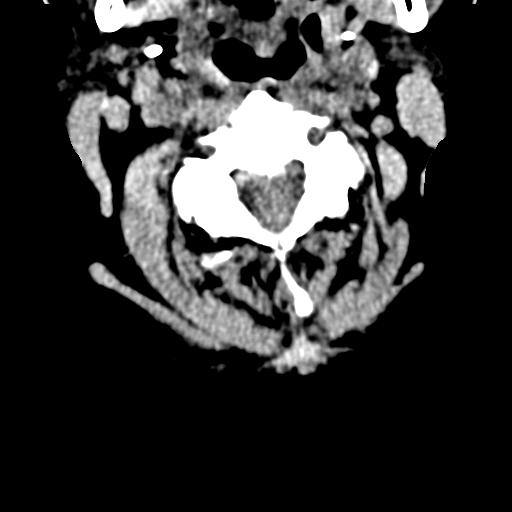
[im 53/71  brain]
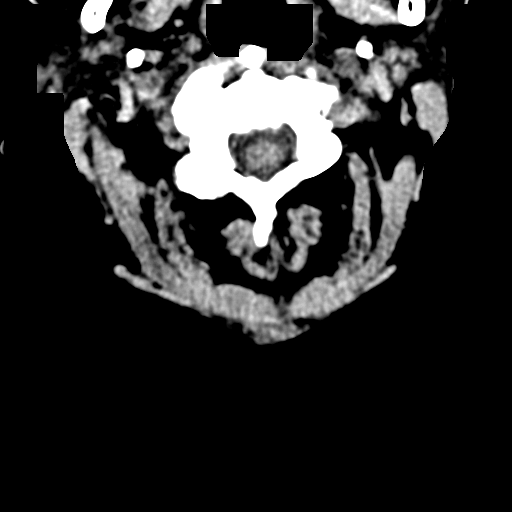
[im 65/71  brain]
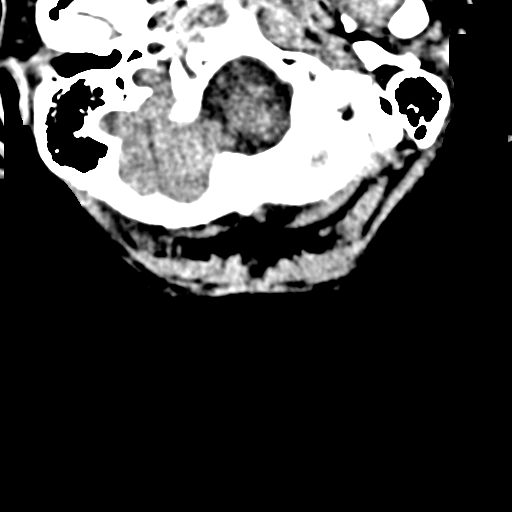

[18 of 30 positions shown; findings below may reference images not displayed]

FINDINGS: CT HEAD FINDINGS

No evidence of parenchymal hemorrhage or extra-axial fluid
collection. No mass lesion, mass effect, or midline shift.

No CT evidence of acute infarction.

Subcortical white matter and periventricular small vessel ischemic
changes.

Global cortical and central atrophy.  Secondary ventriculomegaly.

The visualized paranasal sinuses are essentially clear. The mastoid
air cells are unopacified.

No evidence of calvarial fracture.

CT CERVICAL SPINE FINDINGS

Reversal of the normal cervical lordosis.

No evidence of fracture or dislocation. Vertebral body heights are
maintained. Dens appears intact.

No prevertebral soft tissue swelling.

Moderate degenerative changes with partial fusion at C5-6. Facet
arthropathy at C4-5.

Thyroid is enlarged with prominent right inferior goiter (series 7/
image 71).

Visualized lung apices are clear.
IMPRESSION: No evidence of acute intracranial abnormality. Atrophy with
secondary ventriculomegaly. Small vessel ischemic changes.

No evidence of traumatic injury to the cervical spine. Moderate
degenerative changes at C5-6.

## 2016-03-24 DEATH — deceased
